# Patient Record
Sex: Male | Born: 1975 | Race: Black or African American | Hispanic: No | Marital: Single | Smoking: Current every day smoker
Health system: Southern US, Community
[De-identification: ages and names within clinical notes are randomized; demographics above are authoritative.]

## PROBLEM LIST (undated history)

## (undated) DIAGNOSIS — F1010 Alcohol abuse, uncomplicated: Secondary | ICD-10-CM

## (undated) DIAGNOSIS — I10 Essential (primary) hypertension: Secondary | ICD-10-CM

## (undated) DIAGNOSIS — Z720 Tobacco use: Secondary | ICD-10-CM

## (undated) DIAGNOSIS — N289 Disorder of kidney and ureter, unspecified: Secondary | ICD-10-CM

## (undated) DIAGNOSIS — N4830 Priapism, unspecified: Secondary | ICD-10-CM

---

## 1998-08-22 ENCOUNTER — Emergency Department (HOSPITAL_COMMUNITY): Admission: EM | Admit: 1998-08-22 | Discharge: 1998-08-22 | Payer: Self-pay | Admitting: Emergency Medicine

## 1998-08-22 ENCOUNTER — Encounter: Payer: Self-pay | Admitting: Emergency Medicine

## 2003-06-02 ENCOUNTER — Emergency Department (HOSPITAL_COMMUNITY): Admission: EM | Admit: 2003-06-02 | Discharge: 2003-06-02 | Payer: Self-pay | Admitting: *Deleted

## 2011-03-04 ENCOUNTER — Emergency Department: Payer: Self-pay | Admitting: Emergency Medicine

## 2011-03-31 ENCOUNTER — Emergency Department: Payer: Self-pay | Admitting: Unknown Physician Specialty

## 2012-06-15 ENCOUNTER — Emergency Department (HOSPITAL_COMMUNITY)
Admission: EM | Admit: 2012-06-15 | Discharge: 2012-06-15 | Disposition: A | Discharge status: Home / Self Care | Payer: Self-pay | Attending: Emergency Medicine | Admitting: Emergency Medicine

## 2012-06-15 ENCOUNTER — Encounter (HOSPITAL_COMMUNITY): Payer: Self-pay | Admitting: *Deleted

## 2012-06-15 DIAGNOSIS — Z79899 Other long term (current) drug therapy: Secondary | ICD-10-CM | POA: Insufficient documentation

## 2012-06-15 DIAGNOSIS — I10 Essential (primary) hypertension: Secondary | ICD-10-CM | POA: Insufficient documentation

## 2012-06-15 DIAGNOSIS — F17200 Nicotine dependence, unspecified, uncomplicated: Secondary | ICD-10-CM | POA: Insufficient documentation

## 2012-06-15 DIAGNOSIS — F1010 Alcohol abuse, uncomplicated: Secondary | ICD-10-CM | POA: Insufficient documentation

## 2012-06-15 HISTORY — DX: Essential (primary) hypertension: I10

## 2012-06-15 HISTORY — DX: Alcohol abuse, uncomplicated: F10.10

## 2012-06-15 LAB — RAPID URINE DRUG SCREEN, HOSP PERFORMED
Amphetamines: NOT DETECTED
Barbiturates: NOT DETECTED
Benzodiazepines: NOT DETECTED
Cocaine: NOT DETECTED
Opiates: NOT DETECTED
Tetrahydrocannabinol: POSITIVE — AB

## 2012-06-15 LAB — CBC
HCT: 42.3 % (ref 39.0–52.0)
Hemoglobin: 14.9 g/dL (ref 13.0–17.0)
MCH: 32.3 pg (ref 26.0–34.0)
MCHC: 35.2 g/dL (ref 30.0–36.0)
MCV: 91.8 fL (ref 78.0–100.0)
Platelets: 256 10*3/uL (ref 150–400)
RBC: 4.61 MIL/uL (ref 4.22–5.81)
RDW: 13.1 % (ref 11.5–15.5)
WBC: 9.1 10*3/uL (ref 4.0–10.5)

## 2012-06-15 LAB — ETHANOL: Alcohol, Ethyl (B): <11 mg/dL (ref 0–11)

## 2012-06-15 LAB — COMPREHENSIVE METABOLIC PANEL
ALT: 16 U/L (ref 0–53)
AST: 24 U/L (ref 0–37)
Albumin: 3.7 g/dL (ref 3.5–5.2)
Alkaline Phosphatase: 109 U/L (ref 39–117)
BUN: 11 mg/dL (ref 6–23)
CO2: 23 mEq/L (ref 19–32)
Calcium: 9.2 mg/dL (ref 8.4–10.5)
Chloride: 101 mEq/L (ref 96–112)
Creatinine, Ser: 0.97 mg/dL (ref 0.50–1.35)
GFR calc Af Amer: >90 mL/min (ref >90)
GFR calc non Af Amer: >90 mL/min (ref >90)
Glucose, Bld: 86 mg/dL (ref 70–99)
Potassium: 4 mEq/L (ref 3.5–5.1)
Sodium: 134 mEq/L — ABNORMAL LOW (ref 135–145)
Total Bilirubin: 0.2 mg/dL — ABNORMAL LOW (ref 0.3–1.2)
Total Protein: 7.4 g/dL (ref 6.0–8.3)

## 2012-06-15 NOTE — Discharge Instructions (Signed)
Your testing has been normal, you are not in alcohol withdrawal and can safely pursue outpatient detox therapy. Please see the attached laboratory results from your stay, call the number below if you want to pursue an alternative method of detox. At this time he did not need inpatient detox.  RESOURCE GUIDE  Chronic Pain Problems: Contact [facility]  [phone] Patients need to be referred by their primary care doctor.  Insufficient Money for Medicine: Contact United Way:  call "211."   No Primary Care Doctor: - Call Health Connect  [phone] - can help you locate a primary care doctor that  accepts your insurance, provides certain services, etc. - Physician Referral Service- [phone]  Agencies that provide inexpensive medical care: - [facility]  [phone] - [facility]  [phone] - [facility]  [phone] - Women's Clinic  [phone] - Planned Parenthood  [phone] - [facility]  [phone]  Medicaid-accepting [facility] Providers: - [facility]- [address]  [phone], Mon-Fri 9am-7pm, Sat 9am-1pm - [facility]- [address] [place], Suite [place]  [phone] - [facility]- [address] [place]  [phone] [facility]- [address]  [phone] - Renaye Rakers- [address] [place], [address], [phone]  Only accepts [place] patients after they have their name  applied to their card  Self Pay (no insurance) in [place]: - Sickle Cell Patients: Dr Willey Blade, [facility]  [address] [place], [phone] - [facility]- [address]  [phone]       [facility]- [address]       -     [facility]- see information above (Speak to [employer] if you do not have insurance)       -  [facility]- [address],  [phone]       -  [facility]- [address], [phone]       -  Dr Julio Sicks-  [address], [place], [phone]       -  [facility] - [address], [phone]       -  [facility]- [address] [phone], also [address] [phone]       -    [facility]- [address] [place], [phone], 1st & 3rd Saturday        every month, 10am-1pm  1) Find a Doctor and Pay Out of Pocket Although you won't have to find out who is covered by your insurance plan, it is a good idea to ask around and get recommendations. You will then need to call the office and see if the doctor you have chosen will accept you as a new patient and what types of options they offer for patients who are self-pay. Some doctors offer discounts or will set up payment plans for their patients who do not have insurance, but you will need to ask so you aren't surprised when you get to your appointment.  2) Contact Your Local Health Department Not all health departments have doctors that can see patients for sick visits, but many do, so it is worth a call to see if yours does.  If you don't know where your local health department is, you can check in your phone book. The CDC also has a tool to help you locate your state's health department, and many state websites also have listings of all of their local health departments.  3) Find a Walk-in Clinic If your illness is not likely to be very severe or complicated, you may want to try a walk in clinic. These are popping up all over the country in pharmacies, drugstores, and shopping centers. They're usually staffed by nurse practitioners or physician assistants that have been trained to treat common illnesses and complaints. They're usually fairly quick and inexpensive. However, if you have serious medical issues or chronic medical problems, these are probably not your best  option  STD Testing - [facility] Department of [facility], STD Clinic, [address] [place], phone [phone] or [phone].  Monday - Friday, call for an appointment. [facility] Department of [employer], STD Clinic, [address], [place], phone [phone] or [phone].  Monday - Friday, call for an appointment.  Abuse/Neglect: [facility] Child Abuse Hotline 502-463-5835 [facility] Child Abuse Hotline 331-343-6743 (After Hours)  Emergency Shelter:  [facility] (934)467-3574  Maternity Homes: - Room at the [place] [facility] 330-151-9877 - [facility] ([phone]  MRSA Hotline #:   562-454-4534  [facility] Resources [facility] [place]  United Way [facility] Dept. [address]                 [address]         [address] [place] [address]  Blondell Reveal Phone:  [phone]                                  Phone:  [phone]                   Phone:  [phone]  [facility], [phone] - [facility]- [phone]       -     [facility] in [place], [address]             [phone], Insurance  [place] Child Abuse Hotline 580-763-9523 or 778-030-6728 (After Hours)  Dental Assistance  If unable to pay or uninsured, contact:  [facility]. to become qualified for the adult dental clinic.  Patients with Medicaid: [facility] [phone] [address], [phone] [address] [phone]  If unable to pay, or uninsured, contact [facility] [phone] in [place], [phone] in [place] [facility]) to become qualified for the adult dental clinic  Other Low-Cost Community Dental Services: - [facility]- [address] Natasha Bence [place], [place], [address], [phone], Ext. [phone], 2nd and 4th Thursday of the month at 6:30am.  10 clients each  day by appointment, can sometimes see walk-in patients if someone does not show for an appointment. [facility]- [address] Ether Griffins [place], [place], [address], [phone] - [facility] [address], [place], [place], [address], [phone] - [facility]- [phone] [facility] Health Department- [phone] [facility] [phone]       Behavioral Health Resources in the [facility]  Intensive Outpatient Programs: [facility]      [address] [place], [place] [phone] Both a day and evening program       [facility] Outpatient     [address]        [place], [place] [address] [phone]         ADS: [facility] [address] [place] [phone]  [facility] ACCESS LINE: 5804130912 or [phone] [address] [place], [place] [address] [email or web address]  Behavioral Health Services  Substance Abuse Resources: - [facility]  ([phone] - [facility] [phone])[phone] - [facility] [phone] Floydene Flock [phone] - Residential & Outpatient Substance Abuse Program  ([phone]  Psychological Services: [facility]  [phone] [facility]  [phone] - [facility], [phone] [place]. [address] [place], ACCESS LINE: [phone] or 6400412752, [email or web address]  Mobile Crisis Teams:                                        Therapeutic Alternatives         Mobile Crisis Care Unit ([phone]             [facility] [address] Ginette Otto [phone]                                          Interventionist [address] De[place] [address] [place] [phone]  Self-Help/Support Groups: Mental Health Assoc. of [employer] of support groups 717-636-6700 (call for more info)   Narcotics Anonymous (NA) Caring Services [address] [place] - 2 meetings at this location  Residential Treatment Programs:  [facility]      [address]        [place]       [phone]         [facility] [address], [place] [address] [place], [place]  [address] [phone]  [facility]  [address] [place], [place] [address] [phone] Admissions: 8am-3pm M-F  [facility]     [address]        [place], [place] [address]       [phone]         [facility] [address] Starling Manns [place], [place] [phone]  [facility] [address] [place], [place] [phone]  [facility] - Intensive Outpatient      [address]     [place], [place]       [phone]         [facility] ([facility]     [address] [place], [place] [phone] or [phone]  Residential Treatment Services (RTS), Medicaid [address] [place], [place] [phone]  [facility] [address]                                               [address] [place] [phone]  [facility] Resources: [place]- [phone]               General Therapy                                                Angie Fava, PhD        [address] [place], [place] [address]         [phone]   Insurance  [facility]   [address] [place], [place] [address] [phone]  [facility] [address] [place], [place] [address] [phone] Insurance/Medicaid/sponsorship through [facility] and Families                                               [address]                                        [place], [place] [address]    Therapy/tele-psych/case         [phone]          [facility] [address], [place]  [address]  Adolescent/group home/case management [phone]                                           Creola Corn PhD       General therapy       Insurance   [phone]         Dr. Lolly Mustache, Insurance, M-F [phone]

## 2012-06-15 NOTE — ED Provider Notes (Signed)
History     CSN: [id number]  Arrival date & time 06/15/12  0215   First MD Initiated Contact with Patient 06/15/12 0252      Chief Complaint  Patient presents with  . Alcohol Problem    (Consider location/radiation/quality/duration/timing/severity/associated sxs/prior treatment) HPI Comments: 38 year old male who presents with a complaint of alcohol abuse. He was seen at the adult drug services who told him to come here for medical clearance for outpatient intense substance abuse therapy. The patient states that he was recently incarcerated on drug charges, spent 3 years in jail and when he was really started drinking alcohol heavily. He also drank heavily before going into jail. His last drink was approximately 48 hours ago, he has no withdrawal symptoms and states that he has never had any withdrawal symptoms including no seizures, no tremor, no delirium tremens. He is ambulatory, tolerating oral food and fluids without any difficulty and has had no nausea vomiting or abdominal pain.  Patient is a 37 y.o. male presenting with alcohol problem. The history is provided by the patient.  Alcohol Problem    Past Medical History  Diagnosis Date  . Hypertension   . ETOH abuse     No past surgical history on file.  No family history on file.  History  Substance Use Topics  . Smoking status: Current Every Day Smoker  . Smokeless tobacco: Not on file  . Alcohol Use: Yes      Review of Systems  All other systems reviewed and are negative.    Allergies  Review of patient's allergies indicates no known allergies.  Home Medications   Current Outpatient Rx  Name  Route  Sig  Dispense  Refill  . ADULT MULTIVITAMIN W/MINERALS CH   Oral   Take 1 tablet by mouth daily.         Marland Kitchen NIACIN PO   Oral   Take 1 tablet by mouth daily. OTC           BP 161/67  Pulse 100  Temp 98.5 F (36.9 C) (Oral)  Resp 18  SpO2 100%  Physical Exam  Nursing note and vitals  reviewed. Constitutional: He appears well-developed and well-nourished. No distress.  HENT:  Head: Normocephalic and atraumatic.  Mouth/Throat: Oropharynx is clear and moist. No oropharyngeal exudate.  Eyes: Conjunctivae normal and EOM are normal. Pupils are equal, round, and reactive to light. Right eye exhibits no discharge. Left eye exhibits no discharge. No scleral icterus.  Neck: Normal range of motion. Neck supple. No JVD present. No thyromegaly present.  Cardiovascular: Normal rate, regular rhythm, normal heart sounds and intact distal pulses.  Exam reveals no gallop and no friction rub.   No murmur heard. Pulmonary/Chest: Effort normal and breath sounds normal. No respiratory distress. He has no wheezes. He has no rales.  Abdominal: Soft. Bowel sounds are normal. He exhibits no distension and no mass. There is no tenderness.  Musculoskeletal: Normal range of motion. He exhibits no edema and no tenderness.  Lymphadenopathy:    He has no cervical adenopathy.  Neurological: He is alert. Coordination normal.  Skin: Skin is warm and dry. No rash noted. No erythema.  Psychiatric: He has a normal mood and affect. His behavior is normal.    ED Course  Procedures (including critical care time)   Labs Reviewed  CBC  ETHANOL  COMPREHENSIVE METABOLIC PANEL  URINE RAPID DRUG SCREEN (HOSP PERFORMED)   No results found.   1. Alcohol abuse  MDM  The patient is well-appearing, has no abdominal pain, no jaundice, normal lab work and can be safely discharged to followup with his outpatient drug program. The patient is in agreement and this is what he wants.        Vida Roller, MD 06/15/12 [phone]

## 2012-06-15 NOTE — ED Notes (Signed)
In the Adult Drug Services, and told to come here for chronic etoh before starting there program.

## 2012-06-15 NOTE — ED Notes (Signed)
Pt wanded by security, pt cleared 

## 2012-11-26 ENCOUNTER — Encounter (HOSPITAL_BASED_OUTPATIENT_CLINIC_OR_DEPARTMENT_OTHER): Payer: Self-pay

## 2012-11-26 ENCOUNTER — Emergency Department (HOSPITAL_BASED_OUTPATIENT_CLINIC_OR_DEPARTMENT_OTHER)
Admission: EM | Admit: 2012-11-26 | Discharge: 2012-11-26 | Disposition: A | Discharge status: Home / Self Care | Payer: Self-pay | Attending: Emergency Medicine | Admitting: Emergency Medicine

## 2012-11-26 DIAGNOSIS — I10 Essential (primary) hypertension: Secondary | ICD-10-CM | POA: Insufficient documentation

## 2012-11-26 DIAGNOSIS — F17200 Nicotine dependence, unspecified, uncomplicated: Secondary | ICD-10-CM | POA: Insufficient documentation

## 2012-11-26 DIAGNOSIS — Z8659 Personal history of other mental and behavioral disorders: Secondary | ICD-10-CM | POA: Insufficient documentation

## 2012-11-26 DIAGNOSIS — B86 Scabies: Secondary | ICD-10-CM

## 2012-11-26 DIAGNOSIS — Z79899 Other long term (current) drug therapy: Secondary | ICD-10-CM | POA: Insufficient documentation

## 2012-11-26 MED ORDER — PERMETHRIN 5 % EX CREA: TOPICAL_CREAM | CUTANEOUS | Status: DC

## 2012-11-26 NOTE — ED Notes (Signed)
Pt reports tender rash on back and abdomen.

## 2012-11-26 NOTE — ED Provider Notes (Signed)
Medical screening examination/treatment/procedure(s) were performed by non-physician practitioner and as supervising physician I was immediately available for consultation/collaboration.  Derwood Kaplan, MD 11/26/12 [phone]

## 2012-11-26 NOTE — ED Provider Notes (Signed)
   History    CSN: [id number] Arrival date & time 11/26/12  2042  First MD Initiated Contact with Patient 11/26/12 2051     Chief Complaint  Patient presents with  . Rash   (Consider location/radiation/quality/duration/timing/severity/associated sxs/prior Treatment) HPI Comments: Pt states he developed rash to trunk front and back that is very itchy  Patient is a 37 y.o. male presenting with rash. The history is provided by the patient. No language interpreter was used.  Rash Pain location: trunk. Pain severity:  No pain Duration:  2 weeks Timing:  Constant Progression:  Worsening Context: not diet changes and not eating   Relieved by:  Nothing Worsened by:  Nothing tried Ineffective treatments:  OTC medications  Past Medical History  Diagnosis Date  . Hypertension   . ETOH abuse    History reviewed. No pertinent past surgical history. No family history on file. History  Substance Use Topics  . Smoking status: Current Every Day Smoker -- 1.00 packs/day    Types: Cigarettes  . Smokeless tobacco: Not on file  . Alcohol Use: No     Comment: sober for 20 days    Review of Systems  Constitutional: Negative.   Respiratory: Negative.   Cardiovascular: Negative.   Skin: Positive for rash.    Allergies  Review of patient's allergies indicates no known allergies.  Home Medications   Current Outpatient Rx  Name  Route  Sig  Dispense  Refill  . Multiple Vitamin (MULTIVITAMIN WITH MINERALS) TABS   Oral   Take 1 tablet by mouth daily.         Marland Kitchen NIACIN PO   Oral   Take 1 tablet by mouth daily. OTC         . permethrin (ELIMITE) 5 % cream      Apply to affected area once:shower after 10-14 hours:use again in 2 weeks for return of symptoms   60 g   0    BP 146/103  Pulse 100  Temp(Src) 98.6 F (37 C) (Oral)  Resp 20  Ht 5\' 11"  (1.803 m)  Wt 316 lb (143.337 kg)  BMI 44.09 kg/m2  SpO2 100% Physical Exam  Nursing note and vitals  reviewed. Constitutional: He is oriented to person, place, and time. He appears well-developed and well-nourished.  Cardiovascular: Normal rate and regular rhythm.   Pulmonary/Chest: Effort normal and breath sounds normal.  Musculoskeletal: Normal range of motion.  Neurological: He is alert and oriented to person, place, and time.  Skin:  Papules noted to abdomen and back around the waist line    ED Course  Procedures (including critical care time) Labs Reviewed - No data to display No results found. 1. Scabies     MDM  Rash consistent with scabies:will treat  Teressa Lower, NP 11/26/12 [phone]

## 2014-01-02 ENCOUNTER — Ambulatory Visit: Payer: Self-pay | Admitting: Urology

## 2014-01-02 ENCOUNTER — Emergency Department: Payer: Self-pay | Admitting: Emergency Medicine

## 2014-01-02 LAB — CBC WITH DIFFERENTIAL/PLATELET
BASOS PCT: 1 %
Basophil #: 0.1 10*3/uL (ref 0.0–0.1)
Eosinophil #: 0.1 10*3/uL (ref 0.0–0.7)
Eosinophil %: 1.4 %
HCT: 40.8 % (ref 40.0–52.0)
HGB: 13.7 g/dL (ref 13.0–18.0)
LYMPHS ABS: 1.7 10*3/uL (ref 1.0–3.6)
Lymphocyte %: 21.7 %
MCH: 31.8 pg (ref 26.0–34.0)
MCHC: 33.6 g/dL (ref 32.0–36.0)
MCV: 95 fL (ref 80–100)
MONO ABS: 0.6 x10 3/mm (ref 0.2–1.0)
Monocyte %: 7.1 %
Neutrophil #: 5.5 10*3/uL (ref 1.4–6.5)
Neutrophil %: 68.8 %
PLATELETS: 243 10*3/uL (ref 150–440)
RBC: 4.31 10*6/uL — AB (ref 4.40–5.90)
RDW: 13.4 % (ref 11.5–14.5)
WBC: 8 10*3/uL (ref 3.8–10.6)

## 2014-01-02 LAB — BASIC METABOLIC PANEL
ANION GAP: 8 (ref 7–16)
BUN: 8 mg/dL (ref 7–18)
CALCIUM: 8.8 mg/dL (ref 8.5–10.1)
CHLORIDE: 104 mmol/L (ref 98–107)
Co2: 26 mmol/L (ref 21–32)
Creatinine: 0.9 mg/dL (ref 0.60–1.30)
EGFR (African American): >60
Glucose: 101 mg/dL — ABNORMAL HIGH (ref 65–99)
Osmolality: 274 (ref 275–301)
POTASSIUM: 4.1 mmol/L (ref 3.5–5.1)
Sodium: 138 mmol/L (ref 136–145)

## 2014-09-14 NOTE — Consult Note (Addendum)
PATIENT NAMECashel Hammond, Bill Hammond MR#:  [id number] DATE OF BIRTH:  1976/03/28  DATE OF CONSULTATION:  03/04/2011  REFERRING PHYSICIAN:  Dr. Buford Dresser from the Emergency Department  CONSULTING PHYSICIAN:  Marin Olp, MD  PRIMARY CARE PHYSICIAN: None  REASON FOR CONSULTATION: Priapism.  HISTORY OF PRESENT ILLNESS: The patient is a 39 year old African American male without prior history of priapism who developed a firm, rigid erection at 10:00 in the morning the day of presentation. The patient presented to the Emergency Department after approximately 5.5 hours when the erection did not subside. The patient reports only moderate discomfort (4 to 5/10) but no real pain. The patient denies being involved in any sexual activity. The patient denies using any illicit or prescription drugs or medications. The patient denies any recent genital trauma. The patient denies any family history of sickle cell disease or sickle cell trait.   PAST MEDICAL HISTORY: History of hypertension (managed without medications).   PAST SURGICAL HISTORY: None.  FAMILY HISTORY: Significant for adenocarcinoma of the prostate in the patient's grandfather's brother who was diagnosed in his 20s. Also significant for breast and ovarian carcinoma as well as throat cancer. Again, the patient denies any history of sickle cell disease or sickle cell trait.   ALLERGIES: No known drug allergies.  MEDICATIONS: None.   HABITS: The patient reports a 1 pack per day smoking habit for the past 15 years. Alcohol-The patient report using 1 beer and 3 shots of liquor every night.   REVIEW OF SYSTEMS: GENERAL: Patient denies any fevers or chills, weight loss, night sweats. HEENT: Patient denies any recent colds or flus. RESPIRATORY: Patient denies any shortness of breath or hemoptysis. CARDIOVASCULAR: Patient denies any chest pain or dyspnea on exertion. GASTROINTESTINAL: Patient denies any abdominal pain, nausea, vomiting. GENITOURINARY: Patient  denies any dysuria or penile discharge. MUSCULOSKELETAL: Patient denies any joint pains or muscle aches. ENDOCRINE: Patient denies any diabetes. HEME: Patient denies any bleeding diathesis. PSYCH: Patient denies any depression or anxiety.   PHYSICAL EXAMINATION: GENERAL: Well developed, well nourished African American male lying on the stretcher in no apparent distress.  HEENT: Normocephalic, atraumatic cranium. Patient is anicteric.  NECK: No masses or bruits.  CHEST: Clear to auscultation. Normal effort.   HEART: Regular rate and rhythm without murmurs, gallops or rubs.   ABDOMEN: Soft, flat, nontender, nondistended with normoactive bowel sounds.  GENITOURINARY: Reveals a normal circumcised phallus, fully erect. Scrotal examination reveals a normal scrotum with bilaterally descended testes without masses or tenderness. 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 No varicoceles appreciates.   IMPRESSION: Priapism-idiopathic. The patient was counseled as to the risk for permanent erectile dysfunction with erections lasting more than four hours. The recommendation was made to perform penile aspiration and phenylephrine injection to attempt to achieve detumescence. The patient was counseled as to the possible need for a Winter shunt in the Operating Room under anesthesia should the penile aspiration and injection be unsuccessful. The patient understands and gives his consent to proceed.   RECOMMENDATION: Penile aspiration and irrigation with phenylephrine.    PROCEDURE NOTE:   PROCEDURE: Penile aspiration and irrigation.   INDICATIONS: Idiopathic priapism.   PROCEDURE IN DETAIL: The patient's right penile shaft was prepped with ChloraPrep swabs x3. A 21-gauge Angiocath was then placed in the mid lateral right penile shaft and was attached a 3-way stop cock to which an empty 20 mL syringe and a 10 mL syringe at the other port containing a phenylephrine solution with 400  mg of  phenylephrine per mL of normal saline. 15 mL of dark maroon blood was aspirated from the penile shaft followed by injection of 1 mL of the 400 mg/mL phenylephrine solution. It should be noted that the patient had been attached to a cardiac monitor prior to the initiation of the procedure. Penile aspiration and injection of phenylephrine was repeated after 10 minutes after no noticeable detumescence was appreciated with the first cycle. With the second cycle 20 mL of blood was able to be aspirated followed by another injection of 1 mL or 400 mg of phenylephrine. Some modest detumescence was appreciated and after 10 minutes a third cycle of aspiration and injection was performed. More noticeable detumescence was achieved, approximately 50% and after 10 minutes a fourth aspiration was performed again with 20 mL of blood aspirated and an injection of 200 mcg of phenylephrine resulted in rapid complete detumescence. The total amount of phenylephrine injected equals 1400 mcg. The patient remained stable throughout the procedure with blood pressure remaining less than 140/80. After complete detumescence was achieved, the Angiocath was withdrawn and firm pressure held on the puncture site with a gauze dressing. Observation over the next 10 minutes confirmed persistence in the detumescence of the penis as well as the absence of any ecchymosis or hematoma. It is advised that the patient be monitored for a total of 30 minutes after the procedure and the patient was also advised to abstain from any sexual stimulation or activity for one week. The patient is advised to return immediately to the Emergency Room for any erections lasting more than two hours.   ____________________________ Marin OlpJay H. Tayen Narang, MD jhk:cms D: 03/04/2011 19:04:18 ET T: 03/05/2011 10:23:51 ET JOB#: [id number]  cc: Marin OlpJay H. Arnitra Sokoloski, MD, <Dictator> Marin OlpJAY H Lysha Schrade MD ELECTRONICALLY SIGNED 07/13/2011 13:27

## 2014-09-18 NOTE — Op Note (Signed)
Patient: This 39 year old Male had a surgical procedure performed on 02-Jan-2014.  Post Operative Report:  Pre-Op Diagnosis Priapism   Post-Op Diagnosis Same   Operation Corporal irrigation with phenylephrine injection   Anesthesia Penile Block   Specimen Type None   Findings Penile erection resolved after corporal irrigation and 1200mcg phenylephrine injection   Surgeon Alford HighlandAdam Velmer Broadfoot, MD   Assistant none   EBL: 250 mL   Complications None   Description of Procedure: Mr. Bill Hammond is a 39 year old man with 2 prior priapism episodes who presented to the ED with 5 hours of painful erection. After discussion of the risks and benefits in detail, he elected to undergo bedside corporal irrigation with phenylephrine injection in order to decrease his erection. He understood the risk of erectile dysfunction, persistent penile pain, injection, bleeding requiring transfusion and that the procedure had about a 60% chance of success with an erection having lasted this long.  The patient was placed in a supine position and the genitalia were prepped with betadine and draped.  He was on continuous cardiac monitoring, blood pressure cycling, IV fluids and oxygen.  1mg  of dilaudid and 1mg  of versed were given prior to the procedure. A dorsal penile block and penile ring block was performed with 10cc of 1% lidocaine. A 36F butterfly needle was passed through the left aspect of the glans parallel and 1.5cm lateral to the urethra piercing through the tip of the corporal body.  Approximately 60cc of dark black blood was aspirated from the corporal body.  A second butterfly needle was passed in the same fashion on the right side.  An additional 60cc of blood was aspirated. Using a 3-way stop cock, 10cc normal saline was injected through the left side and aspirated through both sides. This was repeated several times until the returning aspirate was no longer dark.  Following this, 2cc aliquots of phenylephrine  11400mcg/cc (200mcg injections) were injected through the butterfly needle into the corporal body on the left.  This was repeated 5 times until the erection was completely resolved.  Blood pressure was assessed after each injection and never went above 146/86.   The butterfly needles were removed and the erection remained resolved.  The patient rested comfortably in the ED for 2 hours post-procedure to assure resolution.  He tolerated the procedure well.   Orders:   Sodium Chloride 0.9%, 1000 ml at 999 ml/hr, Stop After: 1 Doses, 02-Jan-2014, Completed, Standard   Lidocaine 1% injection, 10 ml, Injectable, STAT, 02-Jan-2014, Completed, Standard   Phenylephrine injection, ( Neo-Synephrine injection )  1 mg, Intramuscular, STAT  Indication: Hypotension in Shock, SVT, 100 mcg per cc, 02-Jan-2014, Completed, Standard   Oxygen- ED, -Liters per :2, 02-Jan-2014, Active, Standard  Electronic Signatures: Graceann CongressKaplan, Aloria Looper G (MD)  (Signed 08-Aug-15 16:54)  Authored: Patient and Date/Time, Operative Note, Orders   Last Updated: 08-Aug-15 16:54 by Graceann CongressKaplan, Jaslynn Thome G (MD)

## 2014-09-18 NOTE — Consult Note (Signed)
Admit Reason:   Priapism (607.3): Onset Date: 02-Jan-2014, Status: Active, Coding System: ICD9, Coded Name: Priapism    recurrent priapism:    HTN:   Lab Results: Routine Chem:  08-Aug-15 14:46   Glucose, Serum  101  BUN 8  Creatinine (comp) 0.90  Sodium, Serum 138  Potassium, Serum 4.1  Chloride, Serum 104  CO2, Serum 26  Calcium (Total), Serum 8.8  Anion Gap 8  Osmolality (calc) 274  eGFR (African American) >60  eGFR (Non-African American) >60 (eGFR values <34m/min/1.73 m2 may be an indication of chronic kidney disease (CKD). Calculated eGFR is useful in patients with stable renal function. The eGFR calculation will not be reliable in acutely ill patients when serum creatinine is changing rapidly. It is not useful in  patients on dialysis. The eGFR calculation may not be applicable to patients at the low and high extremes of body sizes, pregnant women, and vegetarians.)  Result Comment POTASSIUM/CREATININE - Slight hemolysis, interpret results with  - caution.  Result(s) reported on 02 Jan 2014 at 03:08PM.  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  This is his third episode of priapism, his last being in 2012, and prior episodes were managed with corporal irrigation according to the patient.  He denies drug use, no psychiatric medication history, no penile or GU trauma history,  no history of sickle cell anemia or sickle cell trait.   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, now with 5 hours of painful erection   Plan 1) Penile Corporal irrigation with normal saline and phenylephrine injection performed (see separate note) with cardiac monitoring -- erection resolved 2) IV fluids, O2 supplementation & pain control administered 3) Monitor in ED for 2 hours to confirm erection resolved prior to discharge 4) Urology follow-up suggested   Electronic Signatures: KPrentiss Bells(MD)  (Signed 08-Aug-15 16:45)  Authored: Health Issues, Significant Events - History, Labs, Allergies, General Aspect/Present Illness, History and Physical Exam, Impression/Plan   Last Updated: 08-Aug-15 16:45 by KPrentiss Bells(MD)

## 2014-11-24 ENCOUNTER — Emergency Department
Admission: EM | Admit: 2014-11-24 | Discharge: 2014-11-24 | Disposition: A | Discharge status: Home / Self Care | Payer: Self-pay | Attending: Emergency Medicine | Admitting: Emergency Medicine

## 2014-11-24 ENCOUNTER — Encounter: Payer: Self-pay | Admitting: Emergency Medicine

## 2014-11-24 DIAGNOSIS — F1110 Opioid abuse, uncomplicated: Secondary | ICD-10-CM | POA: Insufficient documentation

## 2014-11-24 DIAGNOSIS — I10 Essential (primary) hypertension: Secondary | ICD-10-CM | POA: Insufficient documentation

## 2014-11-24 DIAGNOSIS — Z720 Tobacco use: Secondary | ICD-10-CM | POA: Insufficient documentation

## 2014-11-24 DIAGNOSIS — F1210 Cannabis abuse, uncomplicated: Secondary | ICD-10-CM | POA: Insufficient documentation

## 2014-11-24 DIAGNOSIS — Z79899 Other long term (current) drug therapy: Secondary | ICD-10-CM | POA: Insufficient documentation

## 2014-11-24 DIAGNOSIS — N4830 Priapism, unspecified: Secondary | ICD-10-CM

## 2014-11-24 LAB — URINE DRUG SCREEN, QUALITATIVE (ARMC ONLY)
Amphetamines, Ur Screen: NOT DETECTED
Barbiturates, Ur Screen: NOT DETECTED
Benzodiazepine, Ur Scrn: NOT DETECTED
CANNABINOID 50 NG, UR ~~LOC~~: POSITIVE — AB
COCAINE METABOLITE, UR ~~LOC~~: NOT DETECTED
MDMA (ECSTASY) UR SCREEN: NOT DETECTED
Methadone Scn, Ur: NOT DETECTED
OPIATE, UR SCREEN: POSITIVE — AB
PHENCYCLIDINE (PCP) UR S: NOT DETECTED
Tricyclic, Ur Screen: NOT DETECTED

## 2014-11-24 MED ORDER — ONDANSETRON HCL 4 MG/2ML IJ SOLN
4.0000 mg | Freq: Once | INTRAMUSCULAR | Status: AC
  Administered 2014-11-24: 4 mg via INTRAVENOUS

## 2014-11-24 MED ORDER — PHENYLEPHRINE HCL 0.5 % NA SOLN
NASAL | Status: AC
  Filled 2014-11-24: qty 15

## 2014-11-24 MED ORDER — ONDANSETRON HCL 4 MG/2ML IJ SOLN
INTRAMUSCULAR | Status: AC
  Administered 2014-11-24: 4 mg via INTRAVENOUS
  Filled 2014-11-24: qty 2

## 2014-11-24 MED ORDER — MORPHINE SULFATE 4 MG/ML IJ SOLN
INTRAMUSCULAR | Status: AC
  Administered 2014-11-24: 4 mg via INTRAVENOUS
  Filled 2014-11-24: qty 1

## 2014-11-24 MED ORDER — LIDOCAINE HCL (PF) 1 % IJ SOLN
INTRAMUSCULAR | Status: AC
  Filled 2014-11-24: qty 10

## 2014-11-24 MED ORDER — PHENYLEPHRINE 200 MCG/ML FOR PRIAPISM / HYPOTENSION
200.0000 ug | Freq: Once | INTRAMUSCULAR | Status: DC
  Filled 2014-11-24: qty 50

## 2014-11-24 MED ORDER — MORPHINE SULFATE 4 MG/ML IJ SOLN
4.0000 mg | Freq: Once | INTRAMUSCULAR | Status: AC
  Administered 2014-11-24: 4 mg via INTRAVENOUS

## 2014-11-24 NOTE — ED Provider Notes (Signed)
[facility] Emergency Department Provider Note    ____________________________________________  Time seen: [id number]  I have reviewed the triage vital signs and the nursing notes.   HISTORY  Chief Complaint Penis Pain   History limited by: Not Limited   HPI Bill Hammond is a 39 y.o. male who presents to the emergency department today with concerns for priapism. Patient states that he has had an erect penis for 6 hours.patient states that this is now the fourth time he has had to seek medical care for priapism. He has required phenylephrine injections in the past.he states that he has not had a full workup for priapism. He denies any known triggers. I-125 alcohol maybe trigger but denies any alcohol use on a blunt today's episode. States he did have a three-hour erection last week however that went away on its own.     Past Medical History  Diagnosis Date  . Hypertension   . ETOH abuse     There are no active problems to display for this patient.   No past surgical history on file.  Current Outpatient Rx  Name  Route  Sig  Dispense  Refill  . Multiple Vitamin (MULTIVITAMIN WITH MINERALS) TABS   Oral   Take 1 tablet by mouth daily.         Marland Kitchen. NIACIN PO   Oral   Take 1 tablet by mouth daily. OTC         . permethrin (ELIMITE) 5 % cream      Apply to affected area once:shower after 10-14 hours:use again in 2 weeks for return of symptoms   60 g   0     Allergies Review of patient's allergies indicates no known allergies.  History reviewed. No pertinent family history.  Social History History  Substance Use Topics  . Smoking status: Current Every Day Smoker -- 1.00 packs/day    Types: Cigarettes  . Smokeless tobacco: Not on file  . Alcohol Use: Yes     Comment: sober for 20 days    Review of Systems  Constitutional: Negative for fever. Cardiovascular: Negative for chest pain. Respiratory: Negative for shortness of  breath. Gastrointestinal: Negative for abdominal pain, vomiting and diarrhea. Genitourinary: Negative for dysuria. Musculoskeletal: Negative for back pain. Skin: Negative for rash. Neurological: Negative for headaches, focal weakness or numbness.   10-point ROS otherwise negative.  ____________________________________________   PHYSICAL EXAM:  VITAL SIGNS: ED Triage Vitals  Enc Vitals Group     BP 11/24/14 1029 150/105 mmHg     Pulse Rate 11/24/14 1029 76     Resp 11/24/14 1029 18     Temp 11/24/14 1029 98 F (36.7 C)     Temp Source 11/24/14 1029 Oral     SpO2 11/24/14 1029 98 %     Weight 11/24/14 1029 275 lb (124.739 kg)     Height 11/24/14 1029 6' (1.829 m)     Head Cir --      Peak Flow --      Pain Score 11/24/14 1029 10   Constitutional: Alert and oriented. Well appearing and in no distress. Eyes: Conjunctivae are normal. PERRL. Normal extraocular movements. ENT   Head: Normocephalic and atraumatic.   Nose: No congestion/rhinnorhea.   Mouth/Throat: Mucous membranes are moist.   Neck: No stridor. Cardiovascular: Normal rate Respiratory: Normal respiratory effort without tachypnea nor retractions. Genitourinary: tumid penile shaft. Minimally tender to palpation. No lesions appreciated. Musculoskeletal: Normal range of motion in all extremities.  No joint effusions.  No lower extremity tenderness nor edema. Neurologic:  Normal speech and language. No gross focal neurologic deficits are appreciated. Speech is normal.  Skin:  Skin is warm, dry and intact. No rash noted. Psychiatric: Mood and affect are normal. Speech and behavior are normal. Patient exhibits appropriate insight and judgment.  ____________________________________________    LABS (pertinent positives/negatives)  Labs Reviewed  URINE DRUG SCREEN, QUALITATIVE ([facility] ONLY) - Abnormal; Notable for the following:    Opiate, Ur Screen POSITIVE (*)    Cannabinoid 50 Ng, Ur [place] POSITIVE (*)     All other components within normal limits  SICKLE CELL SCREEN     ____________________________________________   EKG  None  ____________________________________________    RADIOLOGY  None  ____________________________________________   PROCEDURES  Procedure(s) performed: None  Critical Care performed: No ____________________________________________   INITIAL IMPRESSION / ASSESSMENT AND PLAN / ED COURSE  Pertinent labs & imaging results that were available during my care of the patient were reviewed by me and considered in my medical decision making (see chart for details).  Patient presents to the emergency department with priapism that has lasted 6 hours. He has history of same. On exam patient with a tumid shaft. Dr. Apolinar Junes with urology has been contacted who will come and evaluate the patient.  Urology did perform a penile injection with good detumescence. Discuss with patient importance to follow up with urology for further evaluation.  ____________________________________________   FINAL CLINICAL IMPRESSION(S) / ED DIAGNOSES  Final diagnoses:  Priapism     Phineas Semen, MD 11/24/14 [phone]

## 2014-11-24 NOTE — Discharge Instructions (Signed)
Please seek medical attention for any high fevers, chest pain, shortness of breath, change in behavior, persistent vomiting, bloody stool or any other new or concerning symptoms.  Priapism Priapism is a persistent, often painful erection. It is the hardening of the penis in males and of the clitoris in females, even without sexual stimulation. Priapism may come on suddenly. Priapism may last a short while, or may last a long time. Priapism occurs in all ages. The types of priapism include:  Acute prolonged priapism--Priapism that comes on suddenly and lasts.  Recurrent acute priapism--Priapism that comes on suddenly and tends to happen again.  Chronic priapism--Priapism that is persistent but with less of an erection. CAUSES  There are many causes. Causes include:  Blood problems common in people with the following diseases:  Sickle cell disease.  Leukemia.  Side effects of erectile dysfunction medicine. This is the most common cause of priapism.  Side effects of prescription medicine used in the treatment of depression and anxiety.  Illegal use of street drugs such as cocaine and marijuana.  Excessive use of alcohol.  Neurological problems such as multiple sclerosis.  Diabetes mellitus.  The cause may be unknown. SIGNS AND SYMPTOMS  A prolonged erection, usually without sexual stimulation or following the use of erectile dysfunction medicine.  A painful erection. DIAGNOSIS Diagnosis of priapism can usually be confirmed by your health care provider after a physical exam. Your health care provider may have blood tests done to search for a potential cause, such as leukemia or sickle cell disease. TREATMENT  Treatments depend on the cause. Some specific treatments include:  Oxygen and red blood cell transfusions in patients with sickle cell disease.  A special treatment for plasma in those with leukemia.  Removing blood that is trapped.  Treatment with  medicine.  Surgical shunting (a passage that is made to allow blood to flow from one part of the body to another). HOME CARE INFORMATION  Avoid sexual stimulation and intercourse until your health care provider says it is okay.  Avoid the use of alcohol or drugs to minimize recurrence of priapism. SEEK MEDICAL CARE IF:  You experience worsening pain instead of improvement. SEEK IMMEDIATE MEDICAL CARE IF:  You experience fever or shaking chills.  You experience pain, swelling, or redness in your genital or groin area. MAKE SURE YOU:  Understand these instructions.   Will watch your condition.  Will get help right away if you are not doing well or get worse. Document Released: 08/04/2003 Document Revised: 03/04/2013 Document Reviewed: 10/16/2012 [facility] Patient Information 2015 [place], [place]. This information is not intended to replace advice given to you by your health care provider. Make sure you discuss any questions you have with your health care provider.

## 2014-11-24 NOTE — Consult Note (Signed)
Urology Consult  I have been asked to see the patient by Dr. Derrill KayGoodman, for evaluation and management of priapism.  Chief Complaint: Penile pain  History of Present Illness: Bill Hammond is a 39 y.o. year old with a history of recurrent episodes of priapism who presented earlier today to the emergency room after developing a painful erection which failed to resolve around 4 AM this morning.  He does have a history of priapism with his last occurrence a few weeks ago which resolved spontaneously. Prior to this, he did require several procedures in the emergency room, most recently penile irrigation and phenylephrine injection in 12/2014 by Dr. Arlyce DiceKaplan.  He denies any known history of sickle cell anemia and his family or personal history although he's never been worked up for this. He does have a personal history of substance abuse and is currently in rehabilitation. He denies any illicit drug use, marijuana, or any other medications besides his blood pressure medicines over the past few weeks.  He does drink alcohol regularly.  At baseline, he denies any erectile dysfunction.  He denies any difficulty voiding today including no dysuria, hematuria, urinary hesitancy, or incomplete bladder emptying.  Past Medical History  Diagnosis Date  . Hypertension   . ETOH abuse     No past surgical history on file.- patient denies  Home Medications:    Medication List    ASK your doctor about these medications        hydrochlorothiazide 25 MG tablet  Commonly known as:  HYDRODIURIL  Take 25 mg by mouth daily.     lisinopril 10 MG tablet  Commonly known as:  PRINIVIL,ZESTRIL  Take 10 mg by mouth daily.        Allergies: No Known Allergies  History reviewed. No pertinent family history.  No family history of sickle cell anemia.  Social History:  reports that he has been smoking Cigarettes.  He has been smoking about 1.00 pack per day. He does not have any smokeless tobacco history on  file. He reports that he drinks alcohol. He reports that he does not use illicit drugs.  ROS: A complete review of systems was performed.  All systems are negative except for pertinent findings as noted.  Physical Exam:  Vital signs in last 24 hours: Temp:  [98 F (36.7 C)] 98 F (36.7 C) (06/29 1029) Pulse Rate:  [58-76] 68 (06/29 1230) Resp:  [18] 18 (06/29 1029) BP: (134-154)/(96-110) 134/96 mmHg (06/29 1230) SpO2:  [95 %-99 %] 95 % (06/29 1230) Weight:  [275 lb (124.739 kg)] 275 lb (124.739 kg) (06/29 1029) Constitutional:  Alert and oriented, No acute distress HEENT: [place] AT, moist mucus membranes.  Trachea midline, no masses Cardiovascular: Regular rate and rhythm, no clubbing, cyanosis, or edema. Respiratory: Normal respiratory effort, lungs clear bilaterally GI: Abdomen is soft, nontender, nondistended, no abdominal masses GU: No CVA tenderness.  Fully erect uncircumcised phallus, tender with manipulation. Scrotum unremarkable, swelling, testicles descended bilaterally without masses. Skin: No rashes, bruises or suspicious lesions Lymph: No cervical or inguinal adenopathy Neurologic: Grossly intact, no focal deficits, moving all 4 extremities Psychiatric: Normal mood and affect   Laboratory Data:  Sickle cell and drug screen pending  Radiologic Imaging: N/a  Procedure:   Verbal consent for the procedure was obtained from the patient. At this point time, a dorsal penile block was performed using 10 cc of 1% lidocaine injected into Alcock's canals bilaterally.  Prior to any penile manipulation, he did  also receive IV morphine for additional pain control.  The patient was carefully hemodynamically monitored.  A single injection of 400 g of phenylephrine was administered into the patient's right mid corpora after prepping the area. Within a minute or 2, the penis to completely detumesce. He remained hemodynamically stable with vitals unchanged from his  baseline.  Impression/Assessment:  39 year old male with a history of recurrent priapism now status post successful detumescence requiring a single injection of phenylephrine.  I counseled the patient to continue to hold pressure on his penis to avoid any penile hematoma. I do think it's prudent to follow up on his sickle cell as well as urine drug screen given his history of recurrent priapism. I urged him to return to the emergency room as soon as possible should he develop another incidence of painful erection. We discussed the consequences today of untreated or prolonged priapism including erectile dysfunction.  Plan:  -Follow-up sickle cell and drug tox screen -Patient is now adequately detumesced, appropriate for discharge -Patient offered follow-up with urology -Counseled to avoid illicit drugs which may possibly be contributing to his recurrent episodes of priapism.  11/24/2014, 1:07 PM  Vanna Scotland,  MD

## 2014-11-24 NOTE — ED Notes (Signed)
Patient resting in stretcher. Respirations even and unlabored. No obvious distress. No needs/concerns verbalized at this time. Call bell within reach. Encouraged to call with needs. Will continue to monitor. 

## 2014-11-24 NOTE — ED Notes (Signed)
Pt c/o priapism since 0430 this am, has hx of same

## 2014-11-24 NOTE — ED Notes (Signed)
MD at bedside. 

## 2014-11-25 LAB — SICKLE CELL SCREEN: Sickle Cell Screen: NEGATIVE

## 2014-12-08 ENCOUNTER — Encounter (HOSPITAL_COMMUNITY): Payer: Self-pay

## 2014-12-08 ENCOUNTER — Emergency Department (HOSPITAL_COMMUNITY)
Admission: EM | Admit: 2014-12-08 | Discharge: 2014-12-08 | Disposition: A | Discharge status: Home / Self Care | Payer: Self-pay | Attending: Emergency Medicine | Admitting: Emergency Medicine

## 2014-12-08 DIAGNOSIS — N4830 Priapism, unspecified: Secondary | ICD-10-CM | POA: Insufficient documentation

## 2014-12-08 DIAGNOSIS — Z79899 Other long term (current) drug therapy: Secondary | ICD-10-CM | POA: Insufficient documentation

## 2014-12-08 DIAGNOSIS — I10 Essential (primary) hypertension: Secondary | ICD-10-CM | POA: Insufficient documentation

## 2014-12-08 DIAGNOSIS — Z720 Tobacco use: Secondary | ICD-10-CM | POA: Insufficient documentation

## 2014-12-08 HISTORY — DX: Priapism, unspecified: N48.30

## 2014-12-08 MED ORDER — ONDANSETRON 4 MG PO TBDP
4.0000 mg | ORAL_TABLET | Freq: Once | ORAL | Status: AC
  Administered 2014-12-08: 4 mg via ORAL
  Filled 2014-12-08: qty 1

## 2014-12-08 MED ORDER — LIDOCAINE HCL 2 % IJ SOLN
5.0000 mL | Freq: Once | INTRAMUSCULAR | Status: DC
  Filled 2014-12-08: qty 20

## 2014-12-08 MED ORDER — HYDROMORPHONE HCL 2 MG/ML IJ SOLN
2.0000 mg | Freq: Once | INTRAMUSCULAR | Status: AC
Start: 2014-12-08 — End: 2014-12-08
  Administered 2014-12-08: 2 mg via INTRAMUSCULAR
  Filled 2014-12-08: qty 1

## 2014-12-08 MED ORDER — PHENYLEPHRINE 200 MCG/ML FOR PRIAPISM / HYPOTENSION
50.0000 ug | INTRAMUSCULAR | Status: DC | PRN
  Filled 2014-12-08: qty 50

## 2014-12-08 MED ORDER — LISINOPRIL 10 MG PO TABS: 10.0000 mg | ORAL_TABLET | Freq: Every day | ORAL | Status: DC

## 2014-12-08 MED ORDER — HYDROCHLOROTHIAZIDE 25 MG PO TABS: 25.0000 mg | ORAL_TABLET | Freq: Every day | ORAL | Status: DC

## 2014-12-08 NOTE — ED Notes (Signed)
Pt has had multiple treatments for priapism in last 3 years.  Pt denies any erectile meds.  Pt has been tested for sickle cell and is negative.  Pt has had erection ongoing since 3:30 am.

## 2014-12-08 NOTE — ED Provider Notes (Addendum)
CSN: [id number]     Arrival date & time 12/08/14  4098 History   First MD Initiated Contact with Patient 12/08/14 [phone]     Chief Complaint  Patient presents with  . Priapism      (Consider location/radiation/quality/duration/timing/severity/associated sxs/prior Treatment) HPI Comments: Patient with a history of priapism who presents to day with a complaint of priapism since 3:30 this morning. A she got up to go to the bathroom in the middle of the night he noticed it and is not resolved. Patient was seen on June 20 night for the same symptoms and at that time required to phenylephrine injection.  He was drinking alcohol last night and had some marijuana but states in the past the priapism has happened spontaneously. Patient is currently taking no medications and denies any dysuria.  The history is provided by the patient.    Past Medical History  Diagnosis Date  . Hypertension   . ETOH abuse   . Priapism    History reviewed. No pertinent past surgical history. History reviewed. No pertinent family history. History  Substance Use Topics  . Smoking status: Current Every Day Smoker -- 1.00 packs/day    Types: Cigarettes  . Smokeless tobacco: Not on file  . Alcohol Use: Yes     Comment: sober for 20 days    Review of Systems  All other systems reviewed and are negative.     Allergies  Review of patient's allergies indicates no known allergies.  Home Medications   Prior to Admission medications   Medication Sig Start Date End Date Taking? Authorizing Provider  hydrochlorothiazide (HYDRODIURIL) 25 MG tablet Take 25 mg by mouth daily.   Yes Historical Provider, MD  lisinopril (PRINIVIL,ZESTRIL) 10 MG tablet Take 10 mg by mouth daily.   Yes Historical Provider, MD  naproxen (NAPROSYN) 250 MG tablet Take 250 mg by mouth once.   Yes Historical Provider, MD   BP 152/84 mmHg  Pulse 95  Temp(Src) 98.1 F (36.7 C) (Oral)  Resp 18  SpO2 100% Physical Exam  Constitutional: He  appears well-developed and well-nourished. No distress.  HENT:  Head: Normocephalic and atraumatic.  Eyes: EOM are normal. Pupils are equal, round, and reactive to light.  Cardiovascular: Normal rate.   Pulmonary/Chest: Effort normal.  Abdominal: Soft. Bowel sounds are normal. He exhibits no distension. There is no tenderness.  Genitourinary: Testes normal. Circumcised. Penile tenderness present.  Erect penis with tenderness along the shaft.  No discharge noted  Nursing note and vitals reviewed.   ED Course  Procedures (including critical care time) Labs Review Labs Reviewed - No data to display  Imaging Review No results found.   EKG Interpretation None      MDM   Final diagnoses:  Priapism    Patient presents with a history of priapism in the past most recently injected 2 weeks ago with phenylephrine who presents today with priapism since 3:30 this morning. When he woke up at 3:30 to go to the bathroom and had already started and has not improved. He has now had 3 priapism's 1 resolving spontaneously within the last month and a half. Patient did drink alcohol and smoke marijuana last night but states in the past he has not had any marijuana in symptoms still occurred. He is currently not taking any medications. He denies any history of sickle cell disease however he has never been tested.  On exam patient has an fully erect penis with tenderness along the shaft.  Looking at  prior notes from urology 2 weeks ago patient completely detumesced after 400 g of phenylephrine. We'll attempt to inject with phenylephrine and if patient does not detumesced will consult urology.  10:47 AM Patient has completely to mess with only an IM injection of pain medication. He did not require penile injection today. Discussed with patient avoiding alcohol as I can be a trigger for priapism. Also he was given an updated prescription of his blood pressure medication.  Gwyneth SproutWhitney Kiyani Jernigan, MD 12/08/14  1048  Gwyneth SproutWhitney Aurel Nguyen, MD 12/08/14 1054

## 2015-05-05 ENCOUNTER — Emergency Department (HOSPITAL_COMMUNITY)
Admission: EM | Admit: 2015-05-05 | Discharge: 2015-05-05 | Disposition: A | Discharge status: Home / Self Care | Payer: Self-pay | Attending: Emergency Medicine | Admitting: Emergency Medicine

## 2015-05-05 ENCOUNTER — Encounter (HOSPITAL_COMMUNITY): Payer: Self-pay | Admitting: Emergency Medicine

## 2015-05-05 DIAGNOSIS — I10 Essential (primary) hypertension: Secondary | ICD-10-CM | POA: Insufficient documentation

## 2015-05-05 DIAGNOSIS — N4830 Priapism, unspecified: Secondary | ICD-10-CM | POA: Insufficient documentation

## 2015-05-05 DIAGNOSIS — Z79899 Other long term (current) drug therapy: Secondary | ICD-10-CM | POA: Insufficient documentation

## 2015-05-05 DIAGNOSIS — Z791 Long term (current) use of non-steroidal anti-inflammatories (NSAID): Secondary | ICD-10-CM | POA: Insufficient documentation

## 2015-05-05 DIAGNOSIS — F17210 Nicotine dependence, cigarettes, uncomplicated: Secondary | ICD-10-CM | POA: Insufficient documentation

## 2015-05-05 NOTE — Discharge Instructions (Signed)
Priapism °Priapism is an unwanted erection of the penis that usually develops without sexual stimulation or desire. Priapism affects males of all ages. There are three types of priapism: °· Recurrent acute priapism. With this type, erections are painful and last less than 3 hours. The erections come and go. °· Acute prolonged priapism. With this type, erections are painful and last hours to days. This type can lead to erectile dysfunction. °· Persistent priapism. With this type, erections are usually painless and can last weeks to years. The penis gets erect but not rigid. This type can lead to erectile dysfunction. °CAUSES °This condition develops either when blood has difficulty leaving the penis (low-flow priapism) or if too much blood flows into the penis (high-flow priapism). Blood flow issues may be caused by: °· Erectile dysfunction medicine. This is the most common cause. °· Medicine that is used to treat depression and anxiety. °· Blood problems that are common in people who have sickle cell disease or leukemia. °· Use of illegal drugs, such as cocaine and marijuana. °· Excessive alcohol use. °· Neurological problems, such as multiple sclerosis. °· Diabetes mellitus. °· An injury to the penis. °· An infection. °In some cases, the cause may not be known. °SYMPTOMS °Symptoms of this condition include: °· A prolonged erection. °· A painful erection. °DIAGNOSIS °This condition is diagnosed with a physical exam. Blood tests may be done to help identify the cause of the condition. °TREATMENT °Treatment for this condition depends on the cause and the type of priapism. Recurrent acute priapism is often managed at home. Acute prolonged priapism is usually treated at a hospital, where treatment may involve: °· Getting fluid and medicines for pain through an IV tube. °· A blood transfusion. °· A procedure to drain blood from the penis. °· Surgery to make a passageway for blood to flow in the penis (surgical  shunting). °No standard treatment exists for persistent priapism. °HOME CARE INSTRUCTIONS °Managing Recurrent Priapism °· Try taking a warm bath or exercising. °· Keep track of how long your erection lasts. If it does not go away in 3 hours, seek medical care. °General Instructions °· Avoid sexual stimulation and intercourse until your health care provider says that they are okay for you. °· Avoid drugs or alcohol if they caused the priapism. Avoiding them can help to prevent the condition from coming back. °· Drink enough fluid to keep your urine clear or pale yellow. °· Empty your bladder as much as possible. °· Take over-the-counter and prescription medicines only as told by your health care provider. °· Do not take any medicines during an episode unless you get approval from your health care provider. °· Keep all follow-up visits as told by your health care provider. This is important. °SEEK MEDICAL CARE IF: °· Your pain gets worse. °· Your pain does not improve with treatment. °· You have recurrent priapism and your erection does not go away in 3 hours. °SEEK IMMEDIATE MEDICAL CARE IF: °· You have a fever or chills. °· You have pain, swelling, or redness in your genitals or your groin area. °  °This information is not intended to replace advice given to you by your health care provider. Make sure you discuss any questions you have with your health care provider. °  °Document Released: 08/04/2003 Document Revised: 02/02/2015 Document Reviewed: 08/17/2014 °Elsevier Interactive Patient Education ©2016 [employer] ° °

## 2015-05-05 NOTE — ED Notes (Signed)
Pt c/o erection lasting longer than 4 hours-- states it is starting to "go down now" hx of same

## 2015-05-05 NOTE — ED Provider Notes (Signed)
CSN: [id number]     Arrival date & time 05/05/15  [id number] History   First MD Initiated Contact with Patient 05/05/15 [phone]     Chief Complaint  Patient presents with  . priapism      (Consider location/radiation/quality/duration/timing/severity/associated sxs/prior Treatment) HPI Comments: Priapism since 2AM 7 hours Resolved on arrival to ED room Feels sore now, feels pressure, mild, decreased (was severe) Was masturbating trying to get it to go away No medications, no drugs, no hx of sickle cell Hx of prior priapism, unknown etiology, approx 5 times, has required aspiration/injections in the past    Past Medical History  Diagnosis Date  . Hypertension   . ETOH abuse   . Priapism    History reviewed. No pertinent past surgical history. No family history on file. Social History  Substance Use Topics  . Smoking status: Current Every Day Smoker -- 1.00 packs/day    Types: Cigarettes  . Smokeless tobacco: None  . Alcohol Use: Yes     Comment: drinks 2 40 ounces a day    Review of Systems  Constitutional: Negative for fever.  HENT: Negative for sore throat.   Eyes: Negative for visual disturbance.  Respiratory: Negative for shortness of breath.   Cardiovascular: Negative for chest pain.  Gastrointestinal: Negative for abdominal pain.  Genitourinary: Positive for penile swelling and penile pain. Negative for dysuria, urgency, discharge, difficulty urinating and testicular pain.  Musculoskeletal: Negative for back pain and neck stiffness.  Skin: Negative for rash.  Neurological: Negative for syncope and headaches.      Allergies  Review of patient's allergies indicates no known allergies.  Home Medications   Prior to Admission medications   Medication Sig Start Date End Date Taking? Authorizing Provider  hydrochlorothiazide (HYDRODIURIL) 25 MG tablet Take 1 tablet (25 mg total) by mouth daily. 12/08/14   Gwyneth SproutWhitney Plunkett, MD  lisinopril (PRINIVIL,ZESTRIL) 10 MG tablet  Take 1 tablet (10 mg total) by mouth daily. 12/08/14   Gwyneth SproutWhitney Plunkett, MD  naproxen (NAPROSYN) 250 MG tablet Take 250 mg by mouth once.    Historical Provider, MD   BP 162/96 mmHg  Pulse 87  Temp(Src) 97.5 F (36.4 C) (Oral)  Resp 16  Ht 6' (1.829 m)  Wt 265 lb (120.203 kg)  BMI 35.93 kg/m2  SpO2 100% Physical Exam  Constitutional: He is oriented to person, place, and time. He appears well-developed and well-nourished. No distress.  HENT:  Head: Normocephalic and atraumatic.  Eyes: Conjunctivae and EOM are normal.  Neck: Normal range of motion.  Cardiovascular: Normal rate, regular rhythm, normal heart sounds and intact distal pulses.  Exam reveals no gallop and no friction rub.   No murmur heard. Pulmonary/Chest: Effort normal and breath sounds normal. No respiratory distress. He has no wheezes. He has no rales.  Abdominal: Soft. He exhibits no distension. There is no tenderness. There is no guarding.  Genitourinary: Right testis shows no tenderness. Left testis shows no tenderness. Penile tenderness (pt reports mild soreness) present.  Musculoskeletal: He exhibits no edema.  Neurological: He is alert and oriented to person, place, and time.  Skin: Skin is warm and dry. He is not diaphoretic.  Nursing note and vitals reviewed.   ED Course  Procedures (including critical care time) Labs Review Labs Reviewed - No data to display  Imaging Review No results found. I have personally reviewed and evaluated these images and lab results as part of my medical decision-making.   EKG Interpretation None  MDM   Final diagnoses:  None   39yo male with history of priapism, htn presents with concern for priapism.  Pt reports unclear etiology of symptoms in the past, however has required aspiration.  Pt with detumescence on initial exam.  Observed in the ED without return of symptoms. Patient discharged in stable condition with understanding of reasons to return.     Alvira Monday, MD 05/05/15 2156

## 2015-06-11 ENCOUNTER — Emergency Department
Admission: EM | Admit: 2015-06-11 | Discharge: 2015-06-11 | Disposition: A | Discharge status: Home / Self Care | Payer: Self-pay | Attending: Emergency Medicine | Admitting: Emergency Medicine

## 2015-06-11 ENCOUNTER — Emergency Department: Payer: Self-pay

## 2015-06-11 DIAGNOSIS — S0990XA Unspecified injury of head, initial encounter: Secondary | ICD-10-CM | POA: Insufficient documentation

## 2015-06-11 DIAGNOSIS — F17210 Nicotine dependence, cigarettes, uncomplicated: Secondary | ICD-10-CM | POA: Insufficient documentation

## 2015-06-11 DIAGNOSIS — Y9289 Other specified places as the place of occurrence of the external cause: Secondary | ICD-10-CM | POA: Insufficient documentation

## 2015-06-11 DIAGNOSIS — W2209XA Striking against other stationary object, initial encounter: Secondary | ICD-10-CM | POA: Insufficient documentation

## 2015-06-11 DIAGNOSIS — Z7982 Long term (current) use of aspirin: Secondary | ICD-10-CM | POA: Insufficient documentation

## 2015-06-11 DIAGNOSIS — S01312A Laceration without foreign body of left ear, initial encounter: Secondary | ICD-10-CM | POA: Insufficient documentation

## 2015-06-11 DIAGNOSIS — Y9389 Activity, other specified: Secondary | ICD-10-CM | POA: Insufficient documentation

## 2015-06-11 DIAGNOSIS — Y998 Other external cause status: Secondary | ICD-10-CM | POA: Insufficient documentation

## 2015-06-11 DIAGNOSIS — Z79899 Other long term (current) drug therapy: Secondary | ICD-10-CM | POA: Insufficient documentation

## 2015-06-11 DIAGNOSIS — I10 Essential (primary) hypertension: Secondary | ICD-10-CM | POA: Insufficient documentation

## 2015-06-11 NOTE — Discharge Instructions (Signed)
Nonsutured Laceration Care °A laceration is a cut that goes through all layers of the skin and extends into the tissue that is right under the skin. This type of cut is usually stitched up (sutured) or closed with tape (adhesive strips) or skin glue shortly after the injury happens. °However, if the wound is dirty or if several hours pass before medical treatment is provided, it is likely that germs (bacteria) will enter the wound. Closing a laceration after bacteria have entered it increases the risk of infection. In these cases, your health care provider may leave the laceration open (nonsutured) and cover it with a bandage. This type of treatment helps prevent infection and allows the wound to heal from the deepest layer of tissue damage up to the surface. °An open fracture is a type of injury that may involve nonsutured lacerations. An open fracture is a break in a bone that happens along with one or more lacerations through the skin that is near the fracture site. °HOW TO CARE FOR YOUR NONSUTURED LACERATION °· Take or apply over-the-counter and prescription medicines only as told by your health care provider. °· If you were prescribed an antibiotic medicine, take or apply it as told by your health care provider. Do not stop using the antibiotic even if your condition improves. °· Clean the wound one time each day or as told by your health care provider. °¨ Wash the wound with mild soap and water. °¨ Rinse the wound with water to remove all soap. °¨ Pat your wound dry with a clean towel. Do not rub the wound. °· Do not inject anything into the wound unless your health care provider told you to. °· Change any bandages (dressings) as told by your health care provider. This includes changing the dressing if it gets wet, dirty, or starts to smell bad. °· Keep the dressing dry until your health care provider says it can be removed. Do not take baths, swim, or do anything that puts your wound underwater until your  health care provider approves. °· Raise (elevate) the injured area above the level of your heart while you are sitting or lying down, if possible. °· Do not scratch or pick at the wound. °· Check your wound every day for signs of infection. Watch for: °¨ Redness, swelling, or pain. °¨ Fluid, blood, or pus. °· Keep all follow-up visits as told by your health care provider. This is important. °SEEK MEDICAL CARE IF: °· You received a tetanus and shot and you have swelling, severe pain, redness, or bleeding at the injection site.   °· You have a fever. °· Your pain is not controlled with medicine. °· You have increased redness, swelling, or pain at the site of your wound. °· You have fluid, blood, or pus coming from your wound. °· You notice a bad smell coming from your wound or your dressing. °· You notice something coming out of the wound, such as wood or glass. °· You notice a change in the color of your skin near your wound. °· You develop a new rash. °· You need to change the dressing frequently due to fluid, blood, or pus draining from the wound. °· You develop numbness around your wound. °SEEK IMMEDIATE MEDICAL CARE IF: °· Your pain suddenly increases and is severe. °· You develop severe swelling around the wound. °· The wound is on your hand or foot and you cannot properly move a finger or toe. °· The wound is on your hand or   foot and you notice that your fingers or toes look pale or bluish. °· You have a red streak going away from your wound. °  °This information is not intended to replace advice given to you by your health care provider. Make sure you discuss any questions you have with your health care provider. °  °Document Released: 04/11/2006 Document Revised: 09/28/2014 Document Reviewed: 05/10/2014 °Elsevier Interactive Patient Education ©2016 [employer] ° °
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
## 2016-02-21 ENCOUNTER — Encounter: Payer: Self-pay | Admitting: Emergency Medicine

## 2016-02-21 ENCOUNTER — Emergency Department
Admission: EM | Admit: 2016-02-21 | Discharge: 2016-02-21 | Disposition: A | Discharge status: Home / Self Care | Payer: Self-pay | Attending: Emergency Medicine | Admitting: Emergency Medicine

## 2016-02-21 DIAGNOSIS — N342 Other urethritis: Secondary | ICD-10-CM | POA: Insufficient documentation

## 2016-02-21 DIAGNOSIS — Z7982 Long term (current) use of aspirin: Secondary | ICD-10-CM | POA: Insufficient documentation

## 2016-02-21 DIAGNOSIS — F17210 Nicotine dependence, cigarettes, uncomplicated: Secondary | ICD-10-CM | POA: Insufficient documentation

## 2016-02-21 DIAGNOSIS — R55 Syncope and collapse: Secondary | ICD-10-CM | POA: Insufficient documentation

## 2016-02-21 DIAGNOSIS — I10 Essential (primary) hypertension: Secondary | ICD-10-CM | POA: Insufficient documentation

## 2016-02-21 LAB — CBC
HEMATOCRIT: 43.7 % (ref 40.0–52.0)
Hemoglobin: 15.3 g/dL (ref 13.0–18.0)
MCH: 33.2 pg (ref 26.0–34.0)
MCHC: 35.1 g/dL (ref 32.0–36.0)
MCV: 94.7 fL (ref 80.0–100.0)
Platelets: 241 10*3/uL (ref 150–440)
RBC: 4.61 MIL/uL (ref 4.40–5.90)
RDW: 13.9 % (ref 11.5–14.5)
WBC: 7.3 10*3/uL (ref 3.8–10.6)

## 2016-02-21 LAB — URINALYSIS COMPLETE WITH MICROSCOPIC (ARMC ONLY)
BACTERIA UA: NONE SEEN
Bilirubin Urine: NEGATIVE
GLUCOSE, UA: NEGATIVE mg/dL
NITRITE: NEGATIVE
Protein, ur: 100 mg/dL — AB
Specific Gravity, Urine: 1.029 (ref 1.005–1.030)
pH: 5 (ref 5.0–8.0)

## 2016-02-21 LAB — BASIC METABOLIC PANEL
Anion gap: 8 (ref 5–15)
BUN: 8 mg/dL (ref 6–20)
CO2: 23 mmol/L (ref 22–32)
Calcium: 9.1 mg/dL (ref 8.9–10.3)
Chloride: 105 mmol/L (ref 101–111)
Creatinine, Ser: 0.94 mg/dL (ref 0.61–1.24)
GFR calc Af Amer: >60 mL/min (ref >60)
GLUCOSE: 116 mg/dL — AB (ref 65–99)
POTASSIUM: 3.8 mmol/L (ref 3.5–5.1)
Sodium: 136 mmol/L (ref 135–145)

## 2016-02-21 LAB — TROPONIN I

## 2016-02-21 LAB — CHLAMYDIA/NGC RT PCR (ARMC ONLY)
CHLAMYDIA TR: NOT DETECTED
N GONORRHOEAE: NOT DETECTED

## 2016-02-21 MED ORDER — HYDROCHLOROTHIAZIDE 25 MG PO TABS: 25.0000 mg | ORAL_TABLET | Freq: Every day | ORAL | 0 refills | Status: DC

## 2016-02-21 MED ORDER — AZITHROMYCIN 1 G PO PACK
1.0000 g | PACK | Freq: Once | ORAL | Status: AC
  Administered 2016-02-21: 1 g via ORAL
  Filled 2016-02-21 (×2): qty 1

## 2016-02-21 MED ORDER — LISINOPRIL 10 MG PO TABS: 10.0000 mg | ORAL_TABLET | Freq: Every day | ORAL | 11 refills | Status: DC

## 2016-02-21 MED ORDER — CEFTRIAXONE SODIUM 250 MG IJ SOLR
250.0000 mg | Freq: Once | INTRAMUSCULAR | Status: AC
  Administered 2016-02-21: 250 mg via INTRAMUSCULAR
  Filled 2016-02-21: qty 250

## 2016-02-21 NOTE — ED Notes (Signed)
PT. States LOC at work today followed by diaphoresis and headache. Took 2 [employer] and headache resolved. Pt. Thought was BP so came to ED for evaluation. Pt. Denies sx that presented earlier and feels fine now. Pt. Denies recent diet or sleep changes but reports limited sleep due to work and home life.

## 2016-02-21 NOTE — ED Provider Notes (Signed)
[facility] Emergency Department Provider Note   ____________________________________________   First MD Initiated Contact with Patient 02/21/16 1846     (approximate)  I have reviewed the triage vital signs and the nursing notes.   HISTORY  Chief Complaint Hypertension   HPI Bill Hammond is a 40 y.o. male with a history of hypertension who has been off of his hypertension medications since March is presenting after a near syncopal episode. He says that he woke up this morning with a mild headache to the right side of his head. The headache increased over the course the day and he did not drink as much as he normally would. The patient said that he works in a very hot setting and his job and he was sweating profusely and standing up for about 30 minutes when he said that his coworker said that he stared blankly into space. He did not fall. He was able to cover from the episode. Prior to the episode because of his increasing headache he took 2 BC powders and after the episode and at this time he he is headache free. He says that he is feeling back to his baseline at this time. Has never passed out before. Says that he is supposed to be on lisinopril 10 mg as well as HCTZ 25 mg but currently does not have any insurance and is on his medications. He says that he should be getting insurance if the 90 day waiting period for his current job. He denies any chest pain or shortness of breath.   Past Medical History:  Diagnosis Date  . ETOH abuse   . Hypertension   . Priapism     Patient Active Problem List   Diagnosis Date Noted  . Priapism     History reviewed. No pertinent surgical history.  Prior to Admission medications   Medication Sig Start Date End Date Taking? Authorizing Provider  aspirin 325 MG tablet Take 325 mg by mouth daily as needed for mild pain.    Historical Provider, MD  hydrochlorothiazide (HYDRODIURIL) 25 MG tablet Take 1 tablet (25 mg  total) by mouth daily. 12/08/14   Gwyneth Sprout, MD  lisinopril (PRINIVIL,ZESTRIL) 10 MG tablet Take 1 tablet (10 mg total) by mouth daily. 12/08/14   Gwyneth Sprout, MD    Allergies Review of patient's allergies indicates no known allergies.  No family history on file.  Social History Social History  Substance Use Topics  . Smoking status: Current Every Day Smoker    Packs/day: 1.00    Types: Cigarettes  . Smokeless tobacco: Never Used  . Alcohol use Yes     Comment: drinks 2 40 ounces a day    Review of Systems Constitutional: No fever/chills Eyes: No visual changes. ENT: No sore throat. Cardiovascular: Denies chest pain. Respiratory: Denies shortness of breath. Gastrointestinal: No abdominal pain.  No nausea, no vomiting.  No diarrhea.  No constipation. Genitourinary: Negative for dysuria. Musculoskeletal: Negative for back pain. Skin: Negative for rash. Neurological: Negative for focal weakness or numbness.  10-point ROS otherwise negative.  ____________________________________________   PHYSICAL EXAM:  VITAL SIGNS: ED Triage Vitals  Enc Vitals Group     BP 02/21/16 1818 (!) 155/98     Pulse Rate 02/21/16 1818 89     Resp 02/21/16 1818 20     Temp 02/21/16 1818 98.1 F (36.7 C)     Temp Source 02/21/16 1818 Oral     SpO2 02/21/16 1818 96 %  Weight 02/21/16 1815 250 lb (113.4 kg)     Height 02/21/16 1815 6' (1.829 m)     Head Circumference --      Peak Flow --      Pain Score 02/21/16 1815 3     Pain Loc --      Pain Edu? --      Excl. in GC? --     Constitutional: Alert and oriented. Well appearing and in no acute distress. Eyes: Conjunctivae are normal. PERRL. EOMI. Head: Atraumatic. Nose: No congestion/rhinnorhea. Mouth/Throat: Mucous membranes are moist.  Neck: No stridor.   Cardiovascular: Normal rate, regular rhythm. Grossly normal heart sounds.   Respiratory: Normal respiratory effort.  No retractions. Lungs CTAB. Gastrointestinal:  Soft and nontender. No distention.  Musculoskeletal: No lower extremity tenderness nor edema.  No joint effusions. Neurologic:  Normal speech and language. No gross focal neurologic deficits are appreciated. No gait instability. Skin:  Skin is warm, dry and intact. No rash noted. Psychiatric: Mood and affect are normal. Speech and behavior are normal.  ____________________________________________   LABS (all labs ordered are listed, but only abnormal results are displayed)  Labs Reviewed  BASIC METABOLIC PANEL - Abnormal; Notable for the following:       Result Value   Glucose, Bld 116 (*)    All other components within normal limits  URINALYSIS COMPLETEWITH MICROSCOPIC ([facility] ONLY) - Abnormal; Notable for the following:    Color, Urine YELLOW (*)    APPearance CLEAR (*)    Ketones, ur TRACE (*)    Hgb urine dipstick 2+ (*)    Protein, ur 100 (*)    Leukocytes, UA TRACE (*)    Squamous Epithelial / LPF 0-5 (*)    All other components within normal limits  CBC  TROPONIN I   ____________________________________________  EKG  ED ECG REPORT I, Arelia Longest, the attending physician, personally viewed and interpreted this ECG.   Date: 02/21/2016  EKG Time: 1828  Rate: 85  Rhythm: normal sinus rhythm  Axis: Normal  Intervals:none  ST&T Change: No ST segment elevation or depression. No abnormal T-wave inversion.  ____________________________________________  RADIOLOGY   ____________________________________________   PROCEDURES  Procedure(s) performed:   Procedures  Critical Care performed:   ____________________________________________   INITIAL IMPRESSION / ASSESSMENT AND PLAN / ED COURSE  Pertinent labs & imaging results that were available during my care of the patient were reviewed by me and considered in my medical decision making (see chart for details).  ----------------------------------------- 8:09 PM on  02/21/2016 -----------------------------------------  Patient with very reassuring blood work and possibly infection in his urine. He says that he is concerned that his partner may be cheating on him and would like to be covered for STDs. We are pending his gonorrhea and chlamydia as well as urine cultures at this time. We'll prophylactically treat. Patient likely with vasovagal episode. We'll also restart blood pressure medications. Patient is to stay hydrated. Will give strict instructions for no sexual contact until he is fully cleared to make sure that he does not have any confirmed STDs. Patient understands the plan and is willing to comply. Will be discharged home.  Clinical Course     ____________________________________________   FINAL CLINICAL IMPRESSION(S) / ED DIAGNOSES  Urethritis. Near-syncope.    NEW MEDICATIONS STARTED DURING THIS VISIT:  New Prescriptions   No medications on file     Note:  This document was prepared using Dragon voice recognition software and may include unintentional  dictation errors.    Myrna Blazeravid Matthew Alisa Stjames, MD 02/21/16 2011

## 2016-02-21 NOTE — ED Triage Notes (Signed)
States while he was working  He had a near syncopal episode  Became diaphoretic with slight headache   Hx of htn but has been out of meds for about 5 -6 month

## 2016-02-23 LAB — URINE CULTURE: Culture: NO GROWTH

## 2016-10-31 ENCOUNTER — Emergency Department
Admission: EM | Admit: 2016-10-31 | Discharge: 2016-10-31 | Disposition: A | Discharge status: Home / Self Care | Payer: Self-pay | Attending: Student in an Organized Health Care Education/Training Program | Admitting: Student in an Organized Health Care Education/Training Program

## 2016-10-31 DIAGNOSIS — F17210 Nicotine dependence, cigarettes, uncomplicated: Secondary | ICD-10-CM | POA: Insufficient documentation

## 2016-10-31 DIAGNOSIS — I10 Essential (primary) hypertension: Secondary | ICD-10-CM | POA: Insufficient documentation

## 2016-10-31 DIAGNOSIS — M5442 Lumbago with sciatica, left side: Secondary | ICD-10-CM | POA: Insufficient documentation

## 2016-10-31 MED ORDER — ORPHENADRINE CITRATE 30 MG/ML IJ SOLN
60.0000 mg | Freq: Two times a day (BID) | INTRAMUSCULAR | Status: DC
  Administered 2016-10-31: 60 mg via INTRAMUSCULAR

## 2016-10-31 MED ORDER — METHYLPREDNISOLONE SODIUM SUCC 125 MG IJ SOLR
125.0000 mg | Freq: Once | INTRAMUSCULAR | Status: AC
  Administered 2016-10-31: 125 mg via INTRAMUSCULAR
  Filled 2016-10-31: qty 2

## 2016-10-31 MED ORDER — PREDNISONE 10 MG (21) PO TBPK: ORAL_TABLET | Freq: Every day | ORAL | 0 refills | Status: AC

## 2016-10-31 MED ORDER — ORPHENADRINE CITRATE 30 MG/ML IJ SOLN
INTRAMUSCULAR | Status: AC
  Administered 2016-10-31: 60 mg via INTRAMUSCULAR
  Filled 2016-10-31: qty 2

## 2016-10-31 NOTE — ED Provider Notes (Signed)
[facility] Emergency Department Provider Note  ____________________________________________  Time seen: Approximately 9:28 PM  I have reviewed the triage vital signs and the nursing notes.   HISTORY  Chief Complaint Back Pain    HPI Bill Hammond is a 41 y.o. male presenting to the emergency department with 10/10 aching low back pain low back pain that radiates into the left buttocks for the past week. Patient denies falls or incidences of trauma. Patient denies prior surgeries to the low back. Patient denies weakness, paresthesias, radiculopathy, coldness of the lower extremities or bowel or bladder incontinence. No other alleviating measures have been attempted. Patient denies dysuria, increased urinary frequency, hematuria or bilateral flank pain.   Past Medical History:  Diagnosis Date  . ETOH abuse   . Hypertension   . Priapism     Patient Active Problem List   Diagnosis Date Noted  . Priapism     History reviewed. No pertinent surgical history.  Prior to Admission medications   Medication Sig Start Date End Date Taking? Authorizing Provider  aspirin 325 MG tablet Take 325 mg by mouth daily as needed for mild pain.    [provider]  hydrochlorothiazide (HYDRODIURIL) 25 MG tablet Take 1 tablet (25 mg total) by mouth daily. 02/21/16   Schaevitz, Myra Rudeavid Matthew, MD  lisinopril (PRINIVIL,ZESTRIL) 10 MG tablet Take 1 tablet (10 mg total) by mouth daily. 02/21/16 02/20/17  Myrna BlazerSchaevitz, David Matthew, MD  predniSONE (STERAPRED UNI-PAK 21 TAB) 10 MG (21) TBPK tablet Take by mouth daily. Take 6 tablets the first day, take 5 tablets the second day, take 4 tablets the third day, take 3 tablets the fourth day, take 2 tablets the fifth day, take 1 tablet the sixth day. 10/31/16 11/06/16  Orvil FeilWoods, Mickelle Goupil M, PA-C    Allergies Patient has no known allergies.  No family history on file.  Social History Social History  Substance Use Topics  . Smoking  status: Current Every Day Smoker    Packs/day: 1.00    Types: Cigarettes  . Smokeless tobacco: Never Used  . Alcohol use Yes     Comment: drinks 2 40 ounces a day     Review of Systems  Constitutional: No fever/chills Eyes: No visual changes. No discharge ENT: No upper respiratory complaints. Cardiovascular: no chest pain. Respiratory: no cough. No SOB. Gastrointestinal: No abdominal pain.  No nausea, no vomiting.  No diarrhea.  No constipation. Genitourinary: Negative for dysuria. No hematuria Musculoskeletal: Patient has low back pain.  Skin: Negative for rash, abrasions, lacerations, ecchymosis. Neurological: Negative for headaches, focal weakness or numbness. ____________________________________________   PHYSICAL EXAM:  VITAL SIGNS: ED Triage Vitals  Enc Vitals Group     BP 10/31/16 2117 (!) 149/78     Pulse Rate 10/31/16 2117 89     Resp 10/31/16 2117 18     Temp 10/31/16 2117 98.2 F (36.8 C)     Temp Source 10/31/16 2117 Oral     SpO2 10/31/16 2117 98 %     Weight 10/31/16 2113 256 lb (116.1 kg)     Height 10/31/16 2113 6' (1.829 m)     Head Circumference --      Peak Flow --      Pain Score 10/31/16 2113 10     Pain Loc --      Pain Edu? --      Excl. in GC? --     Constitutional: Alert and oriented. Well appearing and in no acute distress.  Eyes: Conjunctivae are normal. PERRL. EOMI. Head: Atraumatic. Cardiovascular: Normal rate, regular rhythm. Normal S1 and S2.  Good peripheral circulation. Respiratory: Normal respiratory effort without tachypnea or retractions. Lungs CTAB. Good air entry to the bases with no decreased or absent breath sounds. Musculoskeletal: Patient has 5 out of 5 strength in the upper and lower extremities bilaterally. Patient has no tenderness along the lumbar, thoracic or cervical spine regions. Positive straight leg raise test, left. Palpable dorsalis pedis pulses bilaterally and symmetrically. Neurologic:  Normal speech and  language. No gross focal neurologic deficits are appreciated.  Skin:  Skin is warm, dry and intact. No rash noted. Psychiatric: Mood and affect are normal. Speech and behavior are normal. Patient exhibits appropriate insight and judgement.   ____________________________________________   LABS (all labs ordered are listed, but only abnormal results are displayed)  Labs Reviewed - No data to display ____________________________________________  EKG   ____________________________________________  RADIOLOGY   No results found.  ____________________________________________    PROCEDURES  Procedure(s) performed:    Procedures    Medications  orphenadrine (NORFLEX) injection 60 mg (60 mg Intramuscular Given 10/31/16 2140)  methylPREDNISolone sodium succinate (SOLU-MEDROL) 125 mg/2 mL injection 125 mg (125 mg Intramuscular Given 10/31/16 2140)     ____________________________________________   INITIAL IMPRESSION / ASSESSMENT AND PLAN / ED COURSE  Pertinent labs & imaging results that were available during my care of the patient were reviewed by me and considered in my medical decision making (see chart for details).  Review of the [place] CSRS was performed in accordance of the [facility] prior to dispensing any controlled drugs.     Assessment and plan: Low back pain: Patient presents to the emergency department with low back pain that radiates into the left buttocks. Patient had no falls or traumas. Further workup with a DG lumbar spine was not warranted at this time. Patient was given an injection of Norflex and Solu-Medrol in the emergency department. He was discharged with tapered prednisone. Vital signs were reassuring prior to discharge. Patient was advised to follow-up with primary care as needed if back pain persist. All patient questions were answered.     ____________________________________________  FINAL CLINICAL IMPRESSION(S) / ED DIAGNOSES  Final diagnoses:   Acute bilateral low back pain with left-sided sciatica      NEW MEDICATIONS STARTED DURING THIS VISIT:  New Prescriptions   PREDNISONE (STERAPRED UNI-PAK 21 TAB) 10 MG (21) TBPK TABLET    Take by mouth daily. Take 6 tablets the first day, take 5 tablets the second day, take 4 tablets the third day, take 3 tablets the fourth day, take 2 tablets the fifth day, take 1 tablet the sixth day.        This chart was dictated using voice recognition software/Dragon. Despite best efforts to proofread, errors can occur which can change the meaning. Any change was purely unintentional.    Orvil Feil, PA-C 10/31/16 2142    Willy Eddy, MD 10/31/16 2312

## 2016-12-19 ENCOUNTER — Emergency Department
Admission: EM | Admit: 2016-12-19 | Discharge: 2016-12-19 | Disposition: A | Discharge status: Home / Self Care | Payer: Self-pay | Attending: Emergency Medicine | Admitting: Emergency Medicine

## 2016-12-19 ENCOUNTER — Encounter: Payer: Self-pay | Admitting: Emergency Medicine

## 2016-12-19 DIAGNOSIS — N4830 Priapism, unspecified: Secondary | ICD-10-CM | POA: Insufficient documentation

## 2016-12-19 DIAGNOSIS — N4833 Priapism, drug-induced: Secondary | ICD-10-CM | POA: Insufficient documentation

## 2016-12-19 DIAGNOSIS — Z7982 Long term (current) use of aspirin: Secondary | ICD-10-CM | POA: Insufficient documentation

## 2016-12-19 DIAGNOSIS — Z79899 Other long term (current) drug therapy: Secondary | ICD-10-CM | POA: Insufficient documentation

## 2016-12-19 DIAGNOSIS — F17210 Nicotine dependence, cigarettes, uncomplicated: Secondary | ICD-10-CM | POA: Insufficient documentation

## 2016-12-19 DIAGNOSIS — I10 Essential (primary) hypertension: Secondary | ICD-10-CM | POA: Insufficient documentation

## 2016-12-19 MED ORDER — PHENYLEPHRINE 200 MCG/ML FOR PRIAPISM / HYPOTENSION: 100.0000 ug | INTRAMUSCULAR | Status: DC | PRN

## 2016-12-19 MED ORDER — HYDROMORPHONE HCL 1 MG/ML IJ SOLN
0.5000 mg | Freq: Once | INTRAMUSCULAR | Status: AC
  Administered 2016-12-19: 0.5 mg via INTRAVENOUS
  Filled 2016-12-19: qty 1

## 2016-12-19 MED ORDER — PHENYLEPHRINE 200 MCG/ML FOR PRIAPISM / HYPOTENSION
500.0000 ug | Freq: Once | INTRAMUSCULAR | Status: AC
  Administered 2016-12-19: 500 ug via INTRACAVERNOUS
  Filled 2016-12-19: qty 50

## 2016-12-19 MED ORDER — ONDANSETRON HCL 4 MG/2ML IJ SOLN
4.0000 mg | Freq: Once | INTRAMUSCULAR | Status: AC
  Administered 2016-12-19: 4 mg via INTRAVENOUS
  Filled 2016-12-19: qty 2

## 2016-12-19 NOTE — ED Triage Notes (Signed)
Arrives with c/p full erection x 3 1/2 hours.  States has history of priapism, symptoms usually related to drinking of non compliance with blood pressure medicine.  Patient states last took blood pressure medication in April.

## 2016-12-19 NOTE — Discharge Instructions (Signed)
Please follow-up with Dr. Apolinar JunesBrandon a urologist for your priapism. Please follow-up with primary care to keep her blood pressure. I know you're taking several blood pressure medications before. I'm going to start you on hydrochlorothiazide or HCTZ 25 mg once a day. Follow-up with primary care to see if you need more. I would try [facility] care or [facility] or [facility] or [facility] or the [facility] or the [facility] or [facility] care.

## 2016-12-19 NOTE — ED Provider Notes (Signed)
[name] Emergency Department Provider Note   ____________________________________________   First MD Initiated Contact with Patient 12/19/16 1649     (approximate)  I have reviewed the triage vital signs and the nursing notes.   HISTORY  Chief Complaint priaprism   HPI Bill Hammond is a 41 y.o. male who reports at least his third episode of priapism. He said last time he was here he got a shot and it made it go down a shot in his arm. He wants to try that again before he gets another injection in his penis. Reviewing the old records it appears that he got a shot of pain medicine we will try some some low-dose of Dilaudid with Zofran to see if that does anything and the urologist in the meantime has been contacted and asked pharmacy to bring up some phenylephrine in a bottle which they're doing. At this point is been about 3 hours and 15 minutes of priapism.   Past Medical History:  Diagnosis Date  . ETOH abuse   . Hypertension   . Priapism     Patient Active Problem List   Diagnosis Date Noted  . Priapism     History reviewed. No pertinent surgical history.  Prior to Admission medications   Medication Sig Start Date End Date Taking? Authorizing Provider  aspirin 325 MG tablet Take 325 mg by mouth daily as needed for mild pain.    [provider]  hydrochlorothiazide (HYDRODIURIL) 25 MG tablet Take 1 tablet (25 mg total) by mouth daily. 02/21/16   Schaevitz, Myra Rudeavid Matthew, MD  lisinopril (PRINIVIL,ZESTRIL) 10 MG tablet Take 1 tablet (10 mg total) by mouth daily. 02/21/16 02/20/17  Schaevitz, Myra Rudeavid Matthew, MD    Allergies Patient has no known allergies.  No family history on file.  Social History Social History  Substance Use Topics  . Smoking status: Current Every Day Smoker    Packs/day: 1.00    Types: Cigarettes  . Smokeless tobacco: Never Used  . Alcohol use Yes     Comment: drinks 2 40 ounces a day    Review of  Systems  Constitutional: No fever/chills Eyes: No visual changes. ENT: No sore throat. Cardiovascular: Denies chest pain. Respiratory: Denies shortness of breath. Gastrointestinal: No abdominal pain.  No nausea, no vomiting.  No diarrhea.  No constipation. Genitourinary: Negative for dysuria. Musculoskeletal: Negative for back pain. Skin: Negative for rash. Neurological: Negative for headaches, focal weakness or numbness.   ____________________________________________   PHYSICAL EXAM:  VITAL SIGNS: ED Triage Vitals  Enc Vitals Group     BP 12/19/16 1644 (!) 151/101     Pulse Rate 12/19/16 1644 88     Resp 12/19/16 1644 16     Temp 12/19/16 1644 98.5 Hammond (36.9 C)     Temp Source 12/19/16 1644 Oral     SpO2 12/19/16 1644 98 %     Weight 12/19/16 1644 267 lb (121.1 kg)     Height 12/19/16 1644 6' (1.829 m)     Head Circumference --      Peak Flow --      Pain Score 12/19/16 1643 10     Pain Loc --      Pain Edu? --      Excl. in GC? --     Constitutional: Alert and oriented. Well appearing and in no acute distress. Eyes: Conjunctivae are normal.  Head: Atraumatic. Nose: No congestion/rhinnorhea. Mouth/Throat: Mucous membranes are moist.   Neck: No  stridor. Cardiovascular: Normal rate, regular rhythm.   Good peripheral circulation. Respiratory: Normal respiratory effort.  No retractions.  Gastrointestinal: Soft and nontender. No distention. No abdominal bruits. No CVA tenderness. Genitourinary: Fully erect penis Musculoskeletal: No lower extremity tenderness nor edema.  No joint effusions. Neurologic:  Normal speech and language. No gross focal neurologic deficits are appreciated. No gait instability. Skin:  Skin is warm, dry and intact. No rash noted. Psychiatric: Mood and affect are normal. Speech and behavior are normal.  ____________________________________________   LABS (all labs ordered are listed, but only abnormal results are displayed)  Labs Reviewed -  No data to display ____________________________________________  EKG   ____________________________________________  RADIOLOGY   ____________________________________________   PROCEDURES  Procedure(s) performed:  Procedures  Critical Care performed:   ____________________________________________   INITIAL IMPRESSION / ASSESSMENT AND PLAN / ED COURSE  Pertinent labs & imaging results that were available during my care of the patient were reviewed by me and considered in my medical decision making (see chart for details).  Patient reports he needs a work note for today. Patient reports she was taking lisinopril 5 mg once a day hydrochlorothiazide and one other medicine but he has not had primary care for some time. So he is not taking his antihypertensives anymore. I will give him a prescription for hydrochlorothiazide 25 mg once a day and a list of primary care offices he can try to follow-up with. He knows about the resident's clinic [facility] in other ways to get medical [place]. He is aware the need to cut back or stop his drinking. He knee knows he needs to follow-up with urology as well.      ____________________________________________   FINAL CLINICAL IMPRESSION(S) / ED DIAGNOSES  Final diagnoses:  None      NEW MEDICATIONS STARTED DURING THIS VISIT:  New Prescriptions   No medications on file     Note:  This document was prepared using Dragon voice recognition software and may include unintentional dictation errors.    Bill Hammond, Bill F, MD 12/19/16 [phone]

## 2016-12-19 NOTE — Consult Note (Signed)
Urology Consult  I have been asked to see the patient by Dr. Darnelle CatalanMalinda, for evaluation and management of priapism.  Chief Complaint: Penile pain  History of Present Illness: Bill Hammond is a 41 y.o. year old with a history of recurrent episodes of priapism who presented earlier today to the emergency room after developing a painful erection which failed x 3 hours. He was able to ejaculate which did not help to resolve the erection. He is having no voiding difficulties. He does have a history of priapism with his last occurrence in 2016 requiring phenylephrine injection.   He denies any known history of sickle cell anemia (screen negative). He does have a personal history of substance abuse and continues to use marijuana frequently. He also notes today that he has not seen a primary care physician in quite some time and his blood pressure has been poorly controlled.  At baseline, he denies any erectile dysfunction.    Past Medical History:  Diagnosis Date  . ETOH abuse   . Hypertension   . Priapism     History reviewed. No pertinent surgical history.- patient denies  Home Medications:  Current Meds  Medication Sig  . aspirin 325 MG tablet Take 325 mg by mouth daily as needed for mild pain.  . hydrochlorothiazide (HYDRODIURIL) 25 MG tablet Take 1 tablet (25 mg total) by mouth daily.  Marland Kitchen. lisinopril (PRINIVIL,ZESTRIL) 10 MG tablet Take 1 tablet (10 mg total) by mouth daily.    Allergies: No Known Allergies  No family history on file.  No family history of sickle cell anemia.  Social History:  reports that he has been smoking Cigarettes.  He has been smoking about 1.00 pack per day. He has never used smokeless tobacco. He reports that he drinks alcohol. He reports that he does not use drugs.  ROS: A complete review of systems was performed.  All systems are negative except for pertinent findings as noted.  Physical Exam:  Vital signs in last 24 hours: Temp:  [98.5 F (36.9  C)] 98.5 F (36.9 C) (07/25 1644) Pulse Rate:  [88] 88 (07/25 1644) Resp:  [16] 16 (07/25 1644) BP: (151)/(101) 151/101 (07/25 1644) SpO2:  [98 %] 98 % (07/25 1644) Weight:  [267 lb (121.1 kg)] 267 lb (121.1 kg) (07/25 1644) Constitutional:  Alert and oriented, No acute distress HEENT: [place] AT, moist mucus membranes.  Trachea midline, no masses Cardiovascular: Regular rate and rhythm, no clubbing, cyanosis, or edema. Respiratory: Normal respiratory effort, lungs clear bilaterally GI: Abdomen is soft, nontender, nondistended, no abdominal masses GU: No CVA tenderness.  Fully erect circumcised phallus, tender with manipulation. Scrotum unremarkable, swelling, testicles descended bilaterally without masses. Skin: No rashes, bruises or suspicious lesions Neurologic: Grossly intact, no focal deficits, moving all 4 extremities Psychiatric: Normal mood and affect   Laboratory Data:  Previous urine drug screens and sickle cell reviewed  Radiologic Imaging: N/a  Procedure:   Verbal consent for the procedure was obtained from the patient. Prior to any penile manipulation, he did also receive IV morphine for additional pain control.  The patient was carefully hemodynamically monitored.  A single injection of 300 g of phenylephrine was administered into the patient's left mid corpora after prepping the area. Within a minute or 2, the penis to completely detumesce.  His blood pressure did increase 182/122 but remained asymptomatic. Heart rate decreased slightly.  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 Status post successful injection with phenylephrine was completely detumescence.  Plan:  -Recommend blood pressure observation, treatment as needed if BP fails to improve back to baseline prior to discharge, discussed with Dr. Darnelle CatalanMalinda -Patient is now adequately detumesced, appropriate for  discharge -Patient highly encouraged to follow-up with urology -Counseled to avoid illicit drugs which may possibly be contributing to his recurrent episodes of priapism. -Encouraged patient to establish primary care physician   12/19/2016, 6:00 PM  Vanna ScotlandAshley Tristy Udovich,  MD

## 2016-12-19 NOTE — ED Notes (Signed)
Urology at bedside.

## 2016-12-19 NOTE — ED Notes (Signed)
EDP at bedside  

## 2016-12-19 NOTE — ED Notes (Signed)
States priaprism for 3 hours, states hx of same, states he drank ETOH last night, denies any medication, awake and alert

## 2017-01-08 ENCOUNTER — Encounter: Payer: Self-pay | Admitting: Emergency Medicine

## 2017-01-08 ENCOUNTER — Emergency Department
Admission: EM | Admit: 2017-01-08 | Discharge: 2017-01-08 | Disposition: A | Discharge status: Home / Self Care | Payer: Self-pay | Attending: Emergency Medicine | Admitting: Emergency Medicine

## 2017-01-08 DIAGNOSIS — F17210 Nicotine dependence, cigarettes, uncomplicated: Secondary | ICD-10-CM | POA: Insufficient documentation

## 2017-01-08 DIAGNOSIS — N4830 Priapism, unspecified: Secondary | ICD-10-CM | POA: Insufficient documentation

## 2017-01-08 DIAGNOSIS — Z79899 Other long term (current) drug therapy: Secondary | ICD-10-CM | POA: Insufficient documentation

## 2017-01-08 DIAGNOSIS — Z7982 Long term (current) use of aspirin: Secondary | ICD-10-CM | POA: Insufficient documentation

## 2017-01-08 DIAGNOSIS — I10 Essential (primary) hypertension: Secondary | ICD-10-CM | POA: Insufficient documentation

## 2017-01-08 MED ORDER — PHENYLEPHRINE 200 MCG/ML FOR PRIAPISM / HYPOTENSION
200.0000 ug | Freq: Once | INTRAMUSCULAR | Status: AC
  Administered 2017-01-08: 200 ug via INTRACAVERNOUS
  Filled 2017-01-08: qty 50

## 2017-01-08 MED ORDER — IBUPROFEN 800 MG PO TABS
800.0000 mg | ORAL_TABLET | Freq: Once | ORAL | Status: AC
  Administered 2017-01-08: 800 mg via ORAL
  Filled 2017-01-08: qty 1

## 2017-01-08 MED ORDER — OXYCODONE-ACETAMINOPHEN 5-325 MG PO TABS
1.0000 | ORAL_TABLET | Freq: Once | ORAL | Status: DC
  Filled 2017-01-08: qty 1

## 2017-01-08 NOTE — Progress Notes (Signed)
Patient presented with priapism which he has had for a proximal 7 hours. At approximately 10 PM this evening I discussed with the patient the nature risks benefits and alternatives to aspiration, irrigation and injection of phenylephrine. All questions answered. He said he only needed an injection and did not need irrigation. He said irrigation caused more "scarring" and that would cause him not to be able to get erections. We did discuss risk of penile scarring, erectile dysfunction among others and he elected to proceed. The patient verbally consented to proceed.  Physical exam: On exam the penis was erect with tense corpora that were painful.  Procedure: The right corpora was prepped with alcohol and the short 22-gauge needle (patient request) was inserted and 1 mL of a 200 g per mL phenylephrine solution made up by the pharmacy was injected slowly over a couple of minutes. This provide quick detumescence. The patient was monitored and blood pressure rose to 200/115 but he had no headache, vision changes or chest pain. He remained stable. I discussed with Dr. Derrill KayGoodman.  Priapism-status post injection and resolution. Patient would be able to learn how to do self injections and I encouraged him to follow-up in the outpatient.

## 2017-01-08 NOTE — ED Notes (Signed)
MD updated pt

## 2017-01-08 NOTE — ED Notes (Signed)
Erection has subsided and pt reports feeling "ghost pain" and feeling as though the erection remains but pt reports this is normal for him.

## 2017-01-08 NOTE — ED Notes (Signed)
Pt refusing PO Percocet as ordered by Dr Derrill KayGoodman for pain. Pt got angry, cussing loudly, stating, "they all don't listen to me! I told the doctor I've been through this before and the only thing that works is an IV and Dilaudid. I used to eat Percocets like candy; they're not going to do anything for me". Dr Derrill KayGoodman made aware of pt's refusal of PO Percocet for pain relief.

## 2017-01-08 NOTE — ED Triage Notes (Signed)
Patient ambulatory to triage with steady gait, without difficulty or distress noted; pt reports priapism since 3pm with hx of same; denies any cause

## 2017-01-08 NOTE — Discharge Instructions (Signed)
Please seek medical attention for any high fevers, chest pain, shortness of breath, change in behavior, persistent vomiting, bloody stool or any other new or concerning symptoms.  

## 2017-01-08 NOTE — ED Provider Notes (Signed)
[facility] Emergency Department Provider Note   ____________________________________________   I have reviewed the triage vital signs and the nursing notes.   HISTORY  Chief Complaint Penis Pain   History limited by: Not Limited   HPI Bill Hammond is a 41 y.o. male who presents to the emergency department today because of concern for priapism. Patient has a history of priapism. Has been to the emergency department multiple times and has required injections in the past. Last time was roughly 3 weeks ago. Patient states that today the episode started roughly 6 hours ago. He waited to see if it would go away on its own. He states that it will resolve on its own from time to time. The patient has not followed up with urology since previous episode. He denies any recent illness or other symptoms.   Past Medical History:  Diagnosis Date  . ETOH abuse   . Hypertension   . Priapism     Patient Active Problem List   Diagnosis Date Noted  . Priapism, drug-induced   . Priapism     History reviewed. No pertinent surgical history.  Prior to Admission medications   Medication Sig Start Date End Date Taking? Authorizing Provider  aspirin 325 MG tablet Take 325 mg by mouth daily as needed for mild pain.    [provider]  hydrochlorothiazide (HYDRODIURIL) 25 MG tablet Take 1 tablet (25 mg total) by mouth daily. 02/21/16   Schaevitz, Myra Rude, MD  lisinopril (PRINIVIL,ZESTRIL) 10 MG tablet Take 1 tablet (10 mg total) by mouth daily. 02/21/16 02/20/17  Schaevitz, Myra Rude, MD    Allergies Patient has no known allergies.  No family history on file.  Social History Social History  Substance Use Topics  . Smoking status: Current Every Day Smoker    Packs/day: 1.00    Types: Cigarettes  . Smokeless tobacco: Never Used  . Alcohol use Yes     Comment: drinks 2 40 ounces a day    Review of Systems Constitutional: No fever/chills Eyes: No  visual changes. ENT: No sore throat. Cardiovascular: Denies chest pain. Respiratory: Denies shortness of breath. Gastrointestinal: No abdominal pain.  No nausea, no vomiting.  No diarrhea.   Genitourinary: Positive for priapism.  Musculoskeletal: Negative for back pain. Skin: Negative for rash. Neurological: Negative for headaches, focal weakness or numbness.  ____________________________________________   PHYSICAL EXAM:  VITAL SIGNS: ED Triage Vitals [01/08/17 2042]  Enc Vitals Group     BP (!) 165/103     Pulse Rate 84     Resp 20     Temp 97.9 F (36.6 C)     Temp Source Oral     SpO2 95 %     Weight 263 lb (119.3 kg)     Height 6' (1.829 m)     Head Circumference      Peak Flow      Pain Score 10   Constitutional: Alert and oriented. Well appearing and in no distress. Eyes: Conjunctivae are normal.  ENT   Head: Normocephalic and atraumatic.   Nose: No congestion/rhinnorhea.   Mouth/Throat: Mucous membranes are moist.   Neck: No stridor. Hematological/Lymphatic/Immunilogical: No cervical lymphadenopathy. Cardiovascular: Normal rate, regular rhythm.  No murmurs, rubs, or gallops.  Respiratory: Normal respiratory effort without tachypnea nor retractions. Breath sounds are clear and equal bilaterally. No wheezes/rales/rhonchi. Gastrointestinal: Soft and non tender. No rebound. No guarding.  Genitourinary: Deferred Musculoskeletal: Normal range of motion in all extremities. No lower  extremity edema. Neurologic:  Normal speech and language. No gross focal neurologic deficits are appreciated.  Skin:  Skin is warm, dry and intact. No rash noted. Psychiatric: Mood and affect are normal. Speech and behavior are normal. Patient exhibits appropriate insight and judgment.  ____________________________________________    LABS (pertinent  positives/negatives)  None  ____________________________________________   EKG  None  ____________________________________________    RADIOLOGY  None  ____________________________________________   PROCEDURES  Procedures  ____________________________________________   INITIAL IMPRESSION / ASSESSMENT AND PLAN / ED COURSE  Pertinent labs & imaging results that were available during my care of the patient were reviewed by me and considered in my medical decision making (see chart for details).  Patient presented to the emergency department today because of concerns for priapism. Patient was seen by Dr. Mena GoesEskridge with urology. Dr. Mena GoesEskridge did inject with phenylephrine which did result in detumescence. Will discharge to follow up with urology.  ____________________________________________   FINAL CLINICAL IMPRESSION(S) / ED DIAGNOSES  Final diagnoses:  Priapism     Note: This dictation was prepared with Dragon dictation. Any transcriptional errors that result from this process are unintentional     Phineas SemenGoodman, Jacoria Keiffer, MD 01/08/17 2238

## 2017-01-14 ENCOUNTER — Emergency Department
Admission: EM | Admit: 2017-01-14 | Discharge: 2017-01-14 | Disposition: A | Discharge status: Home / Self Care | Payer: Self-pay | Attending: Emergency Medicine | Admitting: Emergency Medicine

## 2017-01-14 DIAGNOSIS — I10 Essential (primary) hypertension: Secondary | ICD-10-CM | POA: Insufficient documentation

## 2017-01-14 DIAGNOSIS — Z79899 Other long term (current) drug therapy: Secondary | ICD-10-CM | POA: Insufficient documentation

## 2017-01-14 DIAGNOSIS — Z7982 Long term (current) use of aspirin: Secondary | ICD-10-CM | POA: Insufficient documentation

## 2017-01-14 DIAGNOSIS — N4830 Priapism, unspecified: Secondary | ICD-10-CM | POA: Insufficient documentation

## 2017-01-14 DIAGNOSIS — F17210 Nicotine dependence, cigarettes, uncomplicated: Secondary | ICD-10-CM | POA: Insufficient documentation

## 2017-01-14 MED ORDER — PSEUDOEPHEDRINE HCL 60 MG PO TABS
60.0000 mg | ORAL_TABLET | Freq: Once | ORAL | Status: DC
  Filled 2017-01-14: qty 1

## 2017-01-14 MED ORDER — PHENYLEPHRINE 200 MCG/ML FOR PRIAPISM / HYPOTENSION
200.0000 ug | Freq: Once | INTRAMUSCULAR | Status: AC
  Administered 2017-01-14: 200 ug via INTRACAVERNOUS
  Filled 2017-01-14: qty 50

## 2017-01-14 MED ORDER — PHENYLEPHRINE 200 MCG/ML FOR PRIAPISM / HYPOTENSION
200.0000 ug | Freq: Once | INTRAMUSCULAR | Status: DC
  Filled 2017-01-14: qty 50

## 2017-01-14 NOTE — ED Notes (Addendum)
MD at bedside. Procedure explained to pt, consent signed.

## 2017-01-14 NOTE — ED Triage Notes (Signed)
Patient reports having an erections for the past 7 hours.  Patient reports that he was just seen on Wednesday for the same thing.

## 2017-01-14 NOTE — ED Notes (Signed)
Upon entering room to discharge pt the room was empty, no personal belongings in room, room empty, pt left without discharge, ED Provider Dr. Don Perking aware

## 2017-01-14 NOTE — ED Provider Notes (Signed)
[facility] Emergency Department Provider Note  ____________________________________________  Time seen: Approximately 7:03 AM  I have reviewed the triage vital signs and the nursing notes.   HISTORY  Chief Complaint Penis Pain   HPI TONIA CLOWE is a 41 y.o. 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 Patient seen here 2 days ago with resolution after phenylephrine injection.Patient reports 7 hours of erection. Moderate constant non radiating pain. Patient tried Sudafed at home with no relief.   Past Medical History:  Diagnosis Date  . ETOH abuse   . Hypertension   . Priapism     Patient Active Problem List   Diagnosis Date Noted  . Priapism, drug-induced   . Priapism     No past surgical history on file.  Prior to Admission medications   Medication Sig Start Date End Date Taking? Authorizing Provider  aspirin 325 MG tablet Take 325 mg by mouth daily as needed for mild pain.   Yes [provider]  hydrochlorothiazide (HYDRODIURIL) 25 MG tablet Take 1 tablet (25 mg total) by mouth daily. 02/21/16  Yes Schaevitz, Myra Rude, MD  lisinopril (PRINIVIL,ZESTRIL) 10 MG tablet Take 1 tablet (10 mg total) by mouth daily. 02/21/16 02/20/17 Yes Schaevitz, Myra Rude, MD    Allergies Patient has no known allergies.  No family history on file.  Social History Social History  Substance Use Topics  . Smoking status: Current Every Day Smoker    Packs/day: 1.00    Types: Cigarettes  . Smokeless tobacco: Never Used  . Alcohol use Yes     Comment: drinks 2 40 ounces a day    Review of Systems  Constitutional: Negative for fever. Eyes: Negative for visual changes. ENT: Negative for sore throat. Neck: No neck pain  Cardiovascular: Negative for chest pain. Respiratory: Negative for shortness of breath. Gastrointestinal: Negative for abdominal pain, vomiting or diarrhea. Genitourinary: Negative  for dysuria. + priapism Musculoskeletal: Negative for back pain. Skin: Negative for rash. Neurological: Negative for headaches, weakness or numbness. Psych: No SI or HI  ____________________________________________   PHYSICAL EXAM:  VITAL SIGNS: ED Triage Vitals  Enc Vitals Group     BP 01/14/17 0218 (!) 145/87     Pulse Rate 01/14/17 0218 (!) 114     Resp 01/14/17 0218 20     Temp 01/14/17 0218 98.3 F (36.8 C)     Temp Source 01/14/17 0218 Oral     SpO2 01/14/17 0218 95 %     Weight --      Height --      Head Circumference --      Peak Flow --      Pain Score 01/14/17 0217 10     Pain Loc --      Pain Edu? --      Excl. in GC? --     Constitutional: Alert and oriented. Well appearing and in no apparent distress. HEENT:      Head: Normocephalic and atraumatic.         Eyes: Conjunctivae are normal. Sclera is non-icteric.       Mouth/Throat: Mucous membranes are moist.       Neck: Supple with no signs of meningismus. Cardiovascular: Regular rate and rhythm. No murmurs, gallops, or rubs. 2+ symmetrical distal pulses are present in all extremities. No JVD. Respiratory: Normal respiratory effort. Lungs are clear to auscultation bilaterally. No wheezes, crackles, or rhonchi.  Gastrointestinal: Soft, non tender, and  non distended with positive bowel sounds. No rebound or guarding. Genitourinary: Priapism Musculoskeletal: Nontender with normal range of motion in all extremities. No edema, cyanosis, or erythema of extremities. Neurologic: Normal speech and language. Face is symmetric. Moving all extremities. No gross focal neurologic deficits are appreciated. Skin: Skin is warm, dry and intact. No rash noted. Psychiatric: Mood and affect are normal. Speech and behavior are normal.  ____________________________________________   LABS (all labs ordered are listed, but only abnormal results are displayed)  Labs Reviewed - No data to  display ____________________________________________  EKG  none  ____________________________________________  RADIOLOGY  none  ____________________________________________   PROCEDURES  Procedure(s) performed:yes Procedures   PRIAPISM TREATMENT (injection) Patient was prepped and draped in standard sterile fashion Just prior to procedure a timeout was performed Left corpus cavernosum was entered with a 25-gauge syringe 60 mcg injected  Right corpus cavernosum was entered with a 25-gauge syringe 60 mcg of phenylephrine injected  Some improvement however not fully resolved erection. Left corpus cavernosum was entered with a 25-gauge syringe for repeat dose of 60 mcg  Followed by right corpus cavernosum was entered with a 25-gauge syringe for dose of 200 mcg   The patient tolerated the procedure No complications  Critical Care performed:  None ____________________________________________   INITIAL IMPRESSION / ASSESSMENT AND PLAN / ED COURSE   41 y.o. 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 Patient received 60 mcg of intra cavernosum phenylephrine injection with some improvement but no full resolution of priapism. Discussed with Winship, urology on call who recommended giving 200 mcg. After that patient had significant improvement of his symptoms and left before dc papers were printed and given to him.     Pertinent labs & imaging results that were available during my care of the patient were reviewed by me and considered in my medical decision making (see chart for details).    ____________________________________________   FINAL CLINICAL IMPRESSION(S) / ED DIAGNOSES  Final diagnoses:  Priapism      NEW MEDICATIONS STARTED DURING THIS VISIT:  Discharge Medication List as of 01/14/2017  7:03 AM       Note:  This document was prepared using Dragon voice recognition software and may include  unintentional dictation errors.    Nita Sickle, MD 01/14/17 [id number]

## 2017-01-16 ENCOUNTER — Encounter: Payer: Self-pay | Admitting: Emergency Medicine

## 2017-01-16 ENCOUNTER — Emergency Department
Admission: EM | Admit: 2017-01-16 | Discharge: 2017-01-16 | Disposition: A | Discharge status: Home / Self Care | Payer: Self-pay | Attending: Emergency Medicine | Admitting: Emergency Medicine

## 2017-01-16 DIAGNOSIS — I10 Essential (primary) hypertension: Secondary | ICD-10-CM | POA: Insufficient documentation

## 2017-01-16 DIAGNOSIS — Z7982 Long term (current) use of aspirin: Secondary | ICD-10-CM | POA: Insufficient documentation

## 2017-01-16 DIAGNOSIS — Z79899 Other long term (current) drug therapy: Secondary | ICD-10-CM | POA: Insufficient documentation

## 2017-01-16 DIAGNOSIS — F17210 Nicotine dependence, cigarettes, uncomplicated: Secondary | ICD-10-CM | POA: Insufficient documentation

## 2017-01-16 DIAGNOSIS — N4830 Priapism, unspecified: Secondary | ICD-10-CM | POA: Insufficient documentation

## 2017-01-16 MED ORDER — PHENYLEPHRINE 200 MCG/ML FOR PRIAPISM / HYPOTENSION
200.0000 ug | Freq: Once | INTRAMUSCULAR | Status: DC
  Filled 2017-01-16: qty 50

## 2017-01-16 MED ORDER — ONDANSETRON HCL 4 MG/2ML IJ SOLN
4.0000 mg | Freq: Once | INTRAMUSCULAR | Status: AC
  Administered 2017-01-16: 4 mg via INTRAVENOUS
  Filled 2017-01-16: qty 2

## 2017-01-16 MED ORDER — MORPHINE SULFATE (PF) 4 MG/ML IV SOLN
4.0000 mg | Freq: Once | INTRAVENOUS | Status: AC
  Administered 2017-01-16: 4 mg via INTRAVENOUS
  Filled 2017-01-16: qty 1

## 2017-01-16 NOTE — ED Triage Notes (Signed)
Patient ambulatory to triage with steady gait, without difficulty or distress noted; pt seen here Sunday for priapism which never resolved completely after d/c

## 2017-01-16 NOTE — ED Notes (Signed)
Initiated conversation with pt regarding HTN. Pt states he has hypertension, however is not complaint with treatment. Pt states "that's for the doctor to worry about not me."

## 2017-01-16 NOTE — ED Notes (Signed)
Report to kristin, rn.  

## 2017-01-16 NOTE — ED Provider Notes (Signed)
[facility] Emergency Department Provider Note  ____________________________________________   First MD Initiated Contact with Patient 01/16/17 [phone]     (approximate)  I have reviewed the triage vital signs and the nursing notes.   HISTORY  Chief Complaint Penis Pain   HPI Bill Hammond is a 41 y.o. male with a history of recurrent priapism as well as hypertension and alcohol abuse is present to emergency department with priapism. He has been emergency department 3 times over the past month with priapism. He has had injections performed 2 out of the 3 times daily with detumescence.  He says that his priapism never fully resolved after his visit on August 22. He says that he took a dose of Sudafed at home prior to arrival today without relief. He is describing moderate to severe pain to the penis at this time. Says he usually gets his priapism when he waits too long to urinate. Denies cocaine use.  No history of sickle cell disease. Does not use trazodone.   Past Medical History:  Diagnosis Date  . ETOH abuse   . Hypertension   . Priapism     Patient Active Problem List   Diagnosis Date Noted  . Priapism, drug-induced   . Priapism     History reviewed. No pertinent surgical history.  Prior to Admission medications   Medication Sig Start Date End Date Taking? Authorizing Provider  aspirin 325 MG tablet Take 325 mg by mouth daily as needed for mild pain.    [provider]  hydrochlorothiazide (HYDRODIURIL) 25 MG tablet Take 1 tablet (25 mg total) by mouth daily. 02/21/16   Eithel Ryall, Myra Rude, MD  lisinopril (PRINIVIL,ZESTRIL) 10 MG tablet Take 1 tablet (10 mg total) by mouth daily. 02/21/16 02/20/17  Siri Buege, Myra Rude, MD    Allergies Patient has no known allergies.  No family history on file.  Social History Social History  Substance Use Topics  . Smoking status: Current Every Day Smoker    Packs/day: 1.00    Types:  Cigarettes  . Smokeless tobacco: Never Used  . Alcohol use Yes     Comment: drinks 2 40 ounces a day    Review of Systems  Constitutional: No fever/chills Eyes: No visual changes. ENT: No sore throat. Cardiovascular: Denies chest pain. Respiratory: Denies shortness of breath. Gastrointestinal: No abdominal pain.  No nausea, no vomiting.  No diarrhea.  No constipation. Genitourinary: Negative for dysuria. Musculoskeletal: Negative for back pain. Skin: Negative for rash. Neurological: Negative for headaches, focal weakness or numbness.   ____________________________________________   PHYSICAL EXAM:  VITAL SIGNS: ED Triage Vitals  Enc Vitals Group     BP 01/16/17 0421 (!) 157/106     Pulse Rate 01/16/17 0421 98     Resp 01/16/17 0421 20     Temp 01/16/17 0421 97.9 F (36.6 C)     Temp Source 01/16/17 0421 Oral     SpO2 01/16/17 0421 100 %     Weight 01/16/17 0419 263 lb (119.3 kg)     Height 01/16/17 0419 6' (1.829 m)     Head Circumference --      Peak Flow --      Pain Score 01/16/17 0420 10     Pain Loc --      Pain Edu? --      Excl. in GC? --     Constitutional: Alert and oriented. Well appearing and in no acute distress. Eyes: Conjunctivae are normal.  Head: Atraumatic.  Nose: No congestion/rhinnorhea. Mouth/Throat: Mucous membranes are moist.  Neck: No stridor.   Cardiovascular: Normal rate, regular rhythm. Grossly normal heart sounds.  Respiratory: Normal respiratory effort.  No retractions. Lungs CTAB. Gastrointestinal: Soft and nontender. No distention. No CVA tenderness. Genitourinary: Bilateral corpora cavernosa with moderate engorgement. The penis is not upright but the corpora do feel engorged. The penis is mildly tender to palpation. There is no tenderness or swelling of the testicles bilaterally. Musculoskeletal: No lower extremity tenderness nor edema.  No joint effusions. Neurologic:  Normal speech and language. No gross focal neurologic deficits  are appreciated. Skin:  Skin is warm, dry and intact. No rash noted. Psychiatric: Mood and affect are normal. Speech and behavior are normal.  ____________________________________________   LABS (all labs ordered are listed, but only abnormal results are displayed)  Labs Reviewed - No data to display ____________________________________________  EKG   ____________________________________________  RADIOLOGY   ____________________________________________   PROCEDURES  Procedure(s) performed:   PRIAPISM TREATMENT (injection) Patient was prepped and draped in standard sterile fashion Just prior to procedure a timeout was performed Left corpus cavernosum was entered with a 23-gauge syringe Blood was aspirated 1 cc of 200 mcg/ml phenylephrine was injected The patient tolerated the procedure No complications   Procedures  Critical Care performed:   ____________________________________________   INITIAL IMPRESSION / ASSESSMENT AND PLAN / ED COURSE  Pertinent labs & imaging results that were available during my care of the patient were reviewed by me and considered in my medical decision making (see chart for details).  ----------------------------------------- 7:01 AM on 01/16/2017 ----------------------------------------- I discussed the case with Dr. Mena Goes of urology and he recommends 200 g per mL injection of 1 mL into the corpora. Patient is now pain-free and has Detumesced after injection.  Patient will be discharged home. Will be given urology follow-up. However, the patient says that he has been unable to follow up with urology in the office due to insurance issues.      ____________________________________________   FINAL CLINICAL IMPRESSION(S) / ED DIAGNOSES  Priapism.    NEW MEDICATIONS STARTED DURING THIS VISIT:  New Prescriptions   No medications on file     Note:  This document was prepared using Dragon voice recognition software and  may include unintentional dictation errors.     Myrna Blazer, MD 01/16/17 [phone]

## 2017-01-16 NOTE — ED Notes (Signed)
md on phone speaking with urologist.

## 2017-01-16 NOTE — ED Notes (Signed)
Pillow, blankets provided to pt and visitor. PO fluids provided to visitor per request. Pt resting in no acute distress.

## 2017-09-26 ENCOUNTER — Encounter: Payer: Self-pay | Admitting: Emergency Medicine

## 2017-09-26 ENCOUNTER — Other Ambulatory Visit: Payer: Self-pay

## 2017-09-26 ENCOUNTER — Emergency Department
Admission: EM | Admit: 2017-09-26 | Discharge: 2017-09-26 | Disposition: A | Discharge status: Home / Self Care | Payer: Self-pay | Attending: Emergency Medicine | Admitting: Emergency Medicine

## 2017-09-26 DIAGNOSIS — F17210 Nicotine dependence, cigarettes, uncomplicated: Secondary | ICD-10-CM | POA: Insufficient documentation

## 2017-09-26 DIAGNOSIS — M7712 Lateral epicondylitis, left elbow: Secondary | ICD-10-CM | POA: Insufficient documentation

## 2017-09-26 DIAGNOSIS — Z79899 Other long term (current) drug therapy: Secondary | ICD-10-CM | POA: Insufficient documentation

## 2017-09-26 DIAGNOSIS — I10 Essential (primary) hypertension: Secondary | ICD-10-CM | POA: Insufficient documentation

## 2017-09-26 MED ORDER — METHYLPREDNISOLONE 4 MG PO TABS: 4.0000 mg | ORAL_TABLET | Freq: Every day | ORAL | 0 refills | Status: DC

## 2017-09-26 NOTE — ED Provider Notes (Signed)
[facility] EMERGENCY DEPARTMENT Provider Note   CSN: [id number] Arrival date & time: 09/26/17  1859     History   Chief Complaint No chief complaint on file.   HPI Bill Hammond is a 42 y.o. male.  Presents to the emergency department for evaluation of left elbow pain.  He said pain for 3 weeks.  Pain is moderate.  He points the lateral epicondyle.  No trauma or injury.  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  Patient denies any neck pain burning numbness or tingling.  No weakness.  He took [facility] powder x1 with no improvement.  He has a history of hypertension and he is not diabetic. HPI  Past Medical History:  Diagnosis Date  . ETOH abuse   . Hypertension   . Priapism     Patient Active Problem List   Diagnosis Date Noted  . Priapism, drug-induced   . Priapism     History reviewed. No pertinent surgical history.      Home Medications    Prior to Admission medications   Medication Sig Start Date End Date Taking? Authorizing Provider  aspirin 325 MG tablet Take 325 mg by mouth daily as needed for mild pain.    [provider]  hydrochlorothiazide (HYDRODIURIL) 25 MG tablet Take 1 tablet (25 mg total) by mouth daily. 02/21/16   Schaevitz, Myra Rude, MD  lisinopril (PRINIVIL,ZESTRIL) 10 MG tablet Take 1 tablet (10 mg total) by mouth daily. 02/21/16 02/20/17  Myrna Blazer, MD  methylPREDNISolone (MEDROL) 4 MG tablet Take 1 tablet (4 mg total) by mouth daily. Take as directed 09/26/17   Evon Slack, PA-C    Family History No family history on file.  Social History Social History   Tobacco Use  . Smoking status: Current Every Day Smoker    Packs/day: 1.00    Types: Cigarettes  . Smokeless tobacco: Never Used  Substance Use Topics  . Alcohol use: Yes    Comment: drinks 2 40 ounces a day  . 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   Review of Systems Review of Systems  Constitutional: Negative for fever.  Respiratory: Negative for shortness of breath.   Cardiovascular: Negative for chest pain.  Gastrointestinal: Negative for abdominal pain.  Genitourinary: Negative for difficulty urinating, dysuria and urgency.  Musculoskeletal: Positive for arthralgias. Negative for back pain and myalgias.  Skin: Negative for rash.  Neurological: Negative for dizziness, weakness, numbness and headaches.     Physical Exam Updated Vital Signs BP (!) 183/1 (BP Location: Left Arm)   Pulse 98   Temp 98.2 F (36.8 C)   Resp 18   Ht 6' (1.829 m)   Wt 122 kg (269 lb)   SpO2 99%   BMI 36.48 kg/m   Physical Exam  Constitutional: He is oriented to person, place, and time. He appears well-developed and well-nourished.  HENT:  Head: Normocephalic and atraumatic.  Eyes: Conjunctivae are normal.  Neck: Normal range of motion.  Cardiovascular: Normal rate.  Pulmonary/Chest: Effort normal. No respiratory distress.  Musculoskeletal: Normal range of motion.  Left elbow shows full range of motion.  No swelling warmth erythema or effusion.  Nontender along the olecranon or medial aspect elbow.  Patient is tender along the lateral epicondyles and is painful resisted wrist extension.  Full active range of  motion of the shoulder wrist and digits.  Patient is neurovascular intact in left upper extremity.  Neurological: He is alert and oriented to person, place, and time.  Skin: Skin is warm. No rash noted.  Psychiatric: He has a normal mood and affect. His behavior is normal. Thought content normal.     ED Treatments / Results  Labs (all labs ordered are listed, but only abnormal results are displayed) Labs Reviewed - No data to display  EKG None  Radiology No results found.  Procedures Procedures (including critical care time)  Medications Ordered in ED Medications - No data to display   Initial  Impression / Assessment and Plan / ED Course  I have reviewed the triage vital signs and the nursing notes.  Pertinent labs & imaging results that were available during my care of the patient were reviewed by me and considered in my medical decision making (see chart for details).     42 year old male with lateral epicondylitis to the left elbow.  He will wear tennis elbow strap.  He is placed on a 6-day Medrol Dosepak.  Patient will avoid lifting with the palm down and limp with the palm up.  Patient will follow-up with orthopedics if no improvement in 1 week.  Final Clinical Impressions(s) / ED Diagnoses   Final diagnoses:  Lateral epicondylitis, left elbow    ED Discharge Orders        Ordered    methylPREDNISolone (MEDROL) 4 MG tablet  Daily     09/26/17 1959       Ronnette Juniper 09/26/17 2001    Jeanmarie Plant, MD 09/26/17 2221

## 2017-09-26 NOTE — Discharge Instructions (Addendum)
Please wear tennis elbow strap at all times except for showering.  Avoid lifting with the palm down, lift with the palm up.  Take prednisone taper as prescribed.  Follow-up with orthopedics if no improvement in 1 week.

## 2017-09-26 NOTE — ED Triage Notes (Addendum)
Patient ambulatory to triage with steady gait, without difficulty or distress noted; pt reports x 3wks having pain to left elbow, worse when lifting arm or making fist

## 2018-04-28 ENCOUNTER — Encounter: Payer: Self-pay | Admitting: Emergency Medicine

## 2018-04-28 ENCOUNTER — Other Ambulatory Visit: Payer: Self-pay

## 2018-04-28 ENCOUNTER — Emergency Department
Admission: EM | Admit: 2018-04-28 | Discharge: 2018-04-29 | Disposition: A | Discharge status: Home / Self Care | Payer: Self-pay | Attending: Emergency Medicine | Admitting: Emergency Medicine

## 2018-04-28 DIAGNOSIS — Z79899 Other long term (current) drug therapy: Secondary | ICD-10-CM | POA: Insufficient documentation

## 2018-04-28 DIAGNOSIS — I10 Essential (primary) hypertension: Secondary | ICD-10-CM | POA: Insufficient documentation

## 2018-04-28 DIAGNOSIS — R51 Headache: Secondary | ICD-10-CM | POA: Insufficient documentation

## 2018-04-28 DIAGNOSIS — F17210 Nicotine dependence, cigarettes, uncomplicated: Secondary | ICD-10-CM | POA: Insufficient documentation

## 2018-04-28 DIAGNOSIS — R519 Headache, unspecified: Secondary | ICD-10-CM

## 2018-04-28 LAB — COMPREHENSIVE METABOLIC PANEL WITH GFR
ALT: 18 U/L (ref 0–44)
AST: 26 U/L (ref 15–41)
Albumin: 4.1 g/dL (ref 3.5–5.0)
Alkaline Phosphatase: 82 U/L (ref 38–126)
Anion gap: 9 (ref 5–15)
BUN: 11 mg/dL (ref 6–20)
CO2: 22 mmol/L (ref 22–32)
Calcium: 8.8 mg/dL — ABNORMAL LOW (ref 8.9–10.3)
Chloride: 106 mmol/L (ref 98–111)
Creatinine, Ser: 0.88 mg/dL (ref 0.61–1.24)
GFR calc Af Amer: >60 mL/min
GFR calc non Af Amer: >60 mL/min
Glucose, Bld: 96 mg/dL (ref 70–99)
Potassium: 4.3 mmol/L (ref 3.5–5.1)
Sodium: 137 mmol/L (ref 135–145)
Total Bilirubin: 0.4 mg/dL (ref 0.3–1.2)
Total Protein: 7.9 g/dL (ref 6.5–8.1)

## 2018-04-28 LAB — CBC WITH DIFFERENTIAL/PLATELET
Abs Immature Granulocytes: 0.05 10*3/uL (ref 0.00–0.07)
Basophils Absolute: 0.1 10*3/uL (ref 0.0–0.1)
Basophils Relative: 1 %
Eosinophils Absolute: 0.2 10*3/uL (ref 0.0–0.5)
Eosinophils Relative: 2 %
HCT: 39.2 % (ref 39.0–52.0)
Hemoglobin: 12.6 g/dL — ABNORMAL LOW (ref 13.0–17.0)
Immature Granulocytes: 1 %
Lymphocytes Relative: 25 %
Lymphs Abs: 1.9 10*3/uL (ref 0.7–4.0)
MCH: 30 pg (ref 26.0–34.0)
MCHC: 32.1 g/dL (ref 30.0–36.0)
MCV: 93.3 fL (ref 80.0–100.0)
Monocytes Absolute: 0.5 10*3/uL (ref 0.1–1.0)
Monocytes Relative: 7 %
Neutro Abs: 5 10*3/uL (ref 1.7–7.7)
Neutrophils Relative %: 64 %
Platelets: 338 10*3/uL (ref 150–400)
RBC: 4.2 MIL/uL — ABNORMAL LOW (ref 4.22–5.81)
RDW: 14.5 % (ref 11.5–15.5)
WBC: 7.7 10*3/uL (ref 4.0–10.5)
nRBC: 0 % (ref 0.0–0.2)

## 2018-04-28 LAB — TROPONIN I: Troponin I: <0.03 ng/mL (ref <0.03)

## 2018-04-28 MED ORDER — CLONIDINE HCL 0.1 MG PO TABS
0.2000 mg | ORAL_TABLET | Freq: Once | ORAL | Status: AC
Start: 2018-04-28 — End: 2018-04-28
  Administered 2018-04-28: 0.2 mg via ORAL
  Filled 2018-04-28: qty 2

## 2018-04-28 NOTE — ED Provider Notes (Signed)
[facility] Emergency Department Provider Note ____________________________________________  Time seen: Approximately 10:43 PM  I have reviewed the triage vital signs and the nursing notes.   HISTORY  Chief Complaint Headache   HPI Bill Hammond is a 42 y.o. male who presents to the emergency department for treatment and evaluation of headache. Symptoms started about 2 weeks ago.  Patient states that he has not been taking his antihypertension medications.  He states that when his blood pressure opioids gets a headache like this.  He denies any chest pain, shortness of breath, blurry vision, or other symptoms of concern.  Location: Frontal Similar to previous headaches: Yes Duration: Today TIMING: Upon awakening SEVERITY: 8/10 QUALITY: Pounding CONTEXT: Not taking antihypertensives MODIFYING FACTORS: None ASSOCIATED SYMPTOMS: None Past Medical History:  Diagnosis Date  . ETOH abuse   . Hypertension   . Priapism     Patient Active Problem List   Diagnosis Date Noted  . Priapism, drug-induced   . Priapism     History reviewed. No pertinent surgical history.  Prior to Admission medications   Medication Sig Start Date End Date Taking? Authorizing Provider  aspirin 325 MG tablet Take 325 mg by mouth daily as needed for mild pain.    [provider]  hydrochlorothiazide (HYDRODIURIL) 25 MG tablet Take 1 tablet (25 mg total) by mouth daily. 02/21/16   Schaevitz, Myra Rudeavid Matthew, MD  lisinopril (PRINIVIL,ZESTRIL) 10 MG tablet Take 1 tablet (10 mg total) by mouth daily. 02/21/16 02/20/17  Myrna BlazerSchaevitz, David Matthew, MD  lisinopril-hydrochlorothiazide (PRINZIDE,ZESTORETIC) 10-12.5 MG tablet Take 1 tablet by mouth daily. 04/29/18   Jarnell Cordaro B, FNP  methylPREDNISolone (MEDROL) 4 MG tablet Take 1 tablet (4 mg total) by mouth daily. Take as directed 09/26/17   Evon SlackGaines, Thomas C, PA-C    Allergies Patient has no known allergies.  History reviewed. No  pertinent family history.  Social History Social History   Tobacco Use  . Smoking status: Current Every Day Smoker    Packs/day: 1.00    Types: Cigarettes  . Smokeless tobacco: Never Used  Substance Use Topics  . Alcohol use: Yes    Comment: drinks 2 40 ounces a day  . 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 Eyes: No visual changes. ENT: No sore throat. Respiratory: Denies shortness of breath. Gastrointestinal: No abdominal pain.  No nausea, no vomiting.  No diarrhea.  No constipation. Musculoskeletal: Negative for pain. Skin: Negative for rash. Neurological:Positive for headache, negative for focal weakness or numbness. No confusion or fainting. ___________________________________________   PHYSICAL EXAM:  VITAL SIGNS: ED Triage Vitals  Enc Vitals Group     BP 04/28/18 2230 (!) 182/120     Pulse Rate 04/28/18 2230 (!) 102     Resp 04/28/18 2230 18     Temp 04/28/18 2230 98.1 F (36.7 C)     Temp Source 04/28/18 2230 Oral     SpO2 04/28/18 2230 99 %     Weight 04/28/18 2229 279 lb (126.6 kg)     Height 04/28/18 2229 6' (1.829 m)     Head Circumference --      Peak Flow --      Pain Score 04/28/18 2228 8     Pain Loc --      Pain Edu? --      Excl. in GC? --  Constitutional: Alert and oriented. Well appearing and in no acute distress. Eyes: Conjunctivae are normal. PERRL. EOMI without expressed pain. No evidence of papilledema on limited exam. Head: Atraumatic. Nose: No congestion/rhinnorhea. Mouth/Throat: Mucous membranes are moist.  Oropharynx non-erythematous. Neck: No stridor. Supple, no meningismus.  Cardiovascular: Normal rate, regular rhythm. Grossly normal heart sounds.  Good peripheral circulation. Respiratory: Normal respiratory effort.  No retractions. Lungs CTAB. Gastrointestinal: Soft and nontender. No distention.  Musculoskeletal: No lower extremity  tenderness nor edema.  No joint effusions. Neurologic:  Normal speech and language. No gross focal neurologic deficits are appreciated. No gait instability. Cranial nerves: 2-10 normal as tested. Cerebellar:Normal Romberg, finger-nose-finger, heel to shin, normal gait. Sensorimotor: No aphasia, pronator drift, clonus, sensory loss or abnormal reflexes.  Skin:  Skin is warm, dry and intact. No rash noted. Psychiatric: Mood and affect are normal. Speech and behavior are normal. Normal thought process and cognition.  ____________________________________________   LABS (all labs ordered are listed, but only abnormal results are displayed)  Labs Reviewed  COMPREHENSIVE METABOLIC PANEL - Abnormal; Notable for the following components:      Result Value   Calcium 8.8 (*)    All other components within normal limits  CBC WITH DIFFERENTIAL/PLATELET - Abnormal; Notable for the following components:   RBC 4.20 (*)    Hemoglobin 12.6 (*)    All other components within normal limits  TROPONIN I   ____________________________________________  EKG  ED ECG REPORT I, Kem Boroughs, the attending physician, personally viewed and interpreted this ECG.   Date: 04/29/2018  EKG Time: 12:22 AM  Rate: 91  Rhythm: normal EKG, normal sinus rhythm  Axis: No axis deviation  Intervals:none  ST&T Change: No ST elevation  ____________________________________________  RADIOLOGY  No results found. ____________________________________________   PROCEDURES  Procedure(s) performed:  Procedures  Critical Care performed: None ____________________________________________   INITIAL IMPRESSION / ASSESSMENT AND PLAN / ED COURSE  42 year old male presenting to the emergency department for treatment and evaluation of headache that is likely secondary to hypertension.  Initial management will consist of clonidine 0.2 mg and close monitoring.  ----------------------------------------- 12:10 AM on  04/29/2018 ----------------------------------------- Pressure has decreased somewhat.  153/119.  Patient states that this is what he typically runs.  His headache has improved.   ----------------------------------------- 1:36 AM on 04/29/2018 -----------------------------------------  Patient to be discharged home.  He will be given a prescription for his lisinopril hydrochlorothiazide.  He verbalized intent to schedule a follow-up appointment with primary care and was provided information for [employer].  He is to call tomorrow to see if he can schedule an appointment.  Patient was encouraged to return to the emergency department for any concerning symptoms if unable to see primary care right away.    Pertinent labs & imaging results that were available during my care of the patient were reviewed by me and considered in my medical decision making (see chart for details). ____________________________________________   FINAL CLINICAL IMPRESSION(S) / ED DIAGNOSES  Final diagnoses:  Acute intractable headache, unspecified headache type  Hypertension, unspecified type    ED Discharge Orders         Ordered    lisinopril-hydrochlorothiazide (PRINZIDE,ZESTORETIC) 10-12.5 MG tablet  Daily     04/29/18 0033            Chinita Pester, FNP 04/29/18 0242    Arnaldo Natal, MD 04/29/18 [phone]

## 2018-04-28 NOTE — ED Triage Notes (Signed)
Pt here for headaches on and off last couple weeks. This headache started last night.  Diaphoresis noted to forehead.  + photophobia and vomiting.  Pt is a daily drinker. Has not had a drink since last night.  No hx headaches.

## 2018-04-29 MED ORDER — ACETAMINOPHEN 325 MG PO TABS
650.0000 mg | ORAL_TABLET | Freq: Once | ORAL | Status: AC
Start: 2018-04-29 — End: 2018-04-29
  Administered 2018-04-29: 650 mg via ORAL
  Filled 2018-04-29: qty 2

## 2018-04-29 MED ORDER — IBUPROFEN 600 MG PO TABS
600.0000 mg | ORAL_TABLET | Freq: Once | ORAL | Status: AC
  Administered 2018-04-29: 600 mg via ORAL
  Filled 2018-04-29: qty 1

## 2018-04-29 MED ORDER — LISINOPRIL-HYDROCHLOROTHIAZIDE 10-12.5 MG PO TABS: 1.0000 | ORAL_TABLET | Freq: Every day | ORAL | 1 refills | Status: DC

## 2018-04-29 MED ORDER — LISINOPRIL 10 MG PO TABS
10.0000 mg | ORAL_TABLET | Freq: Once | ORAL | Status: AC
  Administered 2018-04-29: 10 mg via ORAL
  Filled 2018-04-29: qty 1

## 2018-04-29 NOTE — Discharge Instructions (Signed)
Please call and schedule an appointment with primary care.   Return to the ER for symptoms that change or worsen if unable to schedule an appointment. 

## 2018-04-29 NOTE — ED Provider Notes (Signed)
-----------------------------------------   12:35 AM on 04/29/2018 -----------------------------------------  I personally saw the patient with NP Triplett.  In summary, this is a 42 year old male with hypertension who has been out of his medicines for the past several months.  Comes in with a gradual onset headache x2 weeks.  Tells me usually if his nose is bleeding or he has a headache he knows that his blood pressure is high.  Over the course of the evaluation, his systolic blood pressure has improved after oral clonidine.  Headache is resolved and patient states he is currently at his baseline blood pressure of 150s over 110s.  His neck is supple and patient has no focal neurological deficits on my examination.  He will be discharged home with refills of his antihypertensives and follow-up closely with his PCP.  Strict return precautions given.  Patient verbalizes understanding agrees with plan of care.   Irean HongSung, Jade J, MD 04/29/18 [phone]

## 2018-05-17 ENCOUNTER — Emergency Department
Admission: EM | Admit: 2018-05-17 | Discharge: 2018-05-17 | Disposition: A | Discharge status: Home / Self Care | Payer: Self-pay | Attending: Emergency Medicine | Admitting: Emergency Medicine

## 2018-05-17 ENCOUNTER — Other Ambulatory Visit: Payer: Self-pay

## 2018-05-17 DIAGNOSIS — F17210 Nicotine dependence, cigarettes, uncomplicated: Secondary | ICD-10-CM | POA: Insufficient documentation

## 2018-05-17 DIAGNOSIS — I10 Essential (primary) hypertension: Secondary | ICD-10-CM | POA: Insufficient documentation

## 2018-05-17 DIAGNOSIS — N4830 Priapism, unspecified: Secondary | ICD-10-CM | POA: Insufficient documentation

## 2018-05-17 DIAGNOSIS — Z79899 Other long term (current) drug therapy: Secondary | ICD-10-CM | POA: Insufficient documentation

## 2018-05-17 MED ORDER — PHENYLEPHRINE 200 MCG/ML FOR PRIAPISM / HYPOTENSION
200.0000 ug | INTRAMUSCULAR | Status: DC | PRN
  Administered 2018-05-17: 200 ug via INTRACAVERNOUS
  Filled 2018-05-17: qty 50

## 2018-05-17 MED ORDER — LIDOCAINE-PRILOCAINE 2.5-2.5 % EX CREA
TOPICAL_CREAM | Freq: Once | CUTANEOUS | Status: AC
  Administered 2018-05-17: 1 via TOPICAL
  Filled 2018-05-17: qty 5

## 2018-05-17 MED ORDER — SODIUM CHLORIDE 0.9 % IV BOLUS
1000.0000 mL | Freq: Once | INTRAVENOUS | Status: AC
  Administered 2018-05-17: 1000 mL via INTRAVENOUS

## 2018-05-17 MED ORDER — HYDROMORPHONE HCL 1 MG/ML IJ SOLN
1.0000 mg | Freq: Once | INTRAMUSCULAR | Status: AC
  Administered 2018-05-17: 1 mg via INTRAVENOUS
  Filled 2018-05-17: qty 1

## 2018-05-17 NOTE — ED Notes (Signed)
Pt stated that he woke up at 1100 and stated that he was in pain that was similar to what happened last time that he came to hospital with an erection. Pt AxOx4.

## 2018-05-17 NOTE — ED Triage Notes (Signed)
Erection x 4 hours. Has happened before. Did not take medication for erection. Appears uncomfortable. A&O.

## 2018-05-17 NOTE — ED Notes (Signed)
Pt BP elevated. MD notified and okay with pt going home to take normal BP medications. Pt okay with being discharged.

## 2018-05-17 NOTE — ED Provider Notes (Signed)
[facility] Emergency Department Provider Note ____________________________________________   First MD Initiated Contact with Patient 05/17/18 1508     (approximate)  I have reviewed the triage vital signs and the nursing notes.   HISTORY  Chief Complaint Personal Problem    HPI Bill Hammond is a 42 y.o. male with PMH as noted below who presents with priapism, acute onset 4 hours ago, associated with pain but no other acute symptoms.  Patient states he has had priapism several times before although no specific cause was identified.  He denies any cocaine use or any other illicit drugs except for marijuana.  He is not on trazodone or other medications known to cause priapism.  His last episode was last year.   Past Medical History:  Diagnosis Date  . ETOH abuse   . Hypertension   . Priapism     Patient Active Problem List   Diagnosis Date Noted  . Priapism, drug-induced   . Priapism     History reviewed. No pertinent surgical history.  Prior to Admission medications   Medication Sig Start Date End Date Taking? Authorizing Provider  aspirin 325 MG tablet Take 325 mg by mouth daily as needed for mild pain.    [provider]  hydrochlorothiazide (HYDRODIURIL) 25 MG tablet Take 1 tablet (25 mg total) by mouth daily. 02/21/16   Schaevitz, Myra Rudeavid Matthew, MD  lisinopril (PRINIVIL,ZESTRIL) 10 MG tablet Take 1 tablet (10 mg total) by mouth daily. 02/21/16 02/20/17  Myrna BlazerSchaevitz, David Matthew, MD  lisinopril-hydrochlorothiazide (PRINZIDE,ZESTORETIC) 10-12.5 MG tablet Take 1 tablet by mouth daily. 04/29/18   Triplett, Cari B, FNP  methylPREDNISolone (MEDROL) 4 MG tablet Take 1 tablet (4 mg total) by mouth daily. Take as directed 09/26/17   Evon SlackGaines, Thomas C, PA-C    Allergies Patient has no known allergies.  History reviewed. No pertinent family history.  Social History Social History   Tobacco Use  . Smoking status: Current Every Day Smoker   Packs/day: 1.00    Types: Cigarettes  . Smokeless tobacco: Never Used  Substance Use Topics  . Alcohol use: Yes    Comment: drinks 2 40 ounces a day  . 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 Eyes: No redness. ENT: No neck pain.  Cardiovascular: Denies chest pain. Respiratory: Denies shortness of breath. Gastrointestinal: No abdominal pain.  Genitourinary: Negative for dysuria.  Positive for priapism. Musculoskeletal: Negative for back pain. Skin: Negative for rash. Neurological: Negative for headache.   ____________________________________________   PHYSICAL EXAM:  VITAL SIGNS: ED Triage Vitals  Enc Vitals Group     BP 05/17/18 1421 (!) 198/118     Pulse Rate 05/17/18 1421 (!) 104     Resp 05/17/18 1421 12     Temp 05/17/18 1421 98.1 F (36.7 C)     Temp Source 05/17/18 1421 Oral     SpO2 05/17/18 1421 99 %     Weight --      Height --      Head Circumference --      Peak Flow --      Pain Score 05/17/18 1425 10     Pain Loc --      Pain Edu? --      Excl. in GC? --     Constitutional: Alert and oriented.  Slightly uncomfortable appearing but in no acute distress. Eyes: Conjunctivae are  normal.  Head: Atraumatic. Nose: No congestion/rhinnorhea. Mouth/Throat: Mucous membranes are moist.   Neck: Normal range of motion.  Cardiovascular: Good peripheral circulation. Respiratory: Normal respiratory effort.  Gastrointestinal: Soft and nontender. No distention.  Genitourinary: Erect penis with no other acute abnormalities.  Testes nontender. Musculoskeletal: Extremities warm and well perfused.  Neurologic:  Normal speech and language. No gross focal neurologic deficits are appreciated.  Skin:  Skin is warm and dry. No rash noted. Psychiatric: Mood and affect are normal. Speech and behavior are normal.  ____________________________________________   LABS (all labs ordered are listed, but  only abnormal results are displayed)  Labs Reviewed - No data to display ____________________________________________  EKG   ____________________________________________  RADIOLOGY    ____________________________________________   PROCEDURES  Procedure(s) performed: Yes   Intracavernosal phenylephrine injection, priapism  Date/Time: 05/17/2018 4:20 PM Performed by: Dionne BucySiadecki, Maxton Noreen, MD Authorized by: Dionne BucySiadecki, Stiven Kaspar, MD  Consent: Verbal consent obtained. Risks and benefits: risks, benefits and alternatives were discussed Consent given by: patient Patient identity confirmed: verbally with patient and arm band Time out: Immediately prior to procedure a "time out" was called to verify the correct patient, procedure, equipment, support staff and site/side marked as required. Preparation: Patient was prepped and draped in the usual sterile fashion. Local anesthesia used: yes  Anesthesia: Local anesthesia used: yes Local Anesthetic: topical anesthetic  Sedation: Patient sedated: no  Patient tolerance: Patient tolerated the procedure well with no immediate complications Comments: 200 mcg/mL phenylephrine was prepared.  25-gauge needle was inserted into the corpus cavernosum under sterile conditions.  A total of 6 mL of the phenylephrine solution was administered with 1 mL every 2 to 3 minutes until detumescence was achieved.     Critical Care performed: No ____________________________________________   INITIAL IMPRESSION / ASSESSMENT AND PLAN / ED COURSE  Pertinent labs & imaging results that were available during my care of the patient were reviewed by me and considered in my medical decision making (see chart for details).  42 year old male with PMH as noted below including prior history of priapism presents with priapism over approximately last 4 hours.  He denies any other acute symptoms.  On exam the patient is well-appearing and his vital signs are  normal except for hypertension and borderline tachycardia likely related to his acute discomfort.  I consulted Dr. Alvester MorinBell from urology.  He recommends starting with intracavernosal phenylephrine injection.  The patient declines penile nerve block or ring block, so we will use topical anesthetic per his request.  ----------------------------------------- 5:31 PM on 05/17/2018 -----------------------------------------  The patient was injected with phenylephrine as discussed with Dr. Alvester MorinBell.  Dr. Alvester MorinBell also came in towards the end of the procedure and saw the patient in person.  Aspiration and irrigation were not required.  The patient detumesced without complications.  He tolerated the procedure well.  The patient was observed for a further 45 minutes with no recurrence of the priapism.  He is stable for discharge home.  He was instructed to follow-up with the urologist.  Referral has been provided.  Return precautions given, and he expressed understanding. ____________________________________________   FINAL CLINICAL IMPRESSION(S) / ED DIAGNOSES  Final diagnoses:  Priapism      NEW MEDICATIONS STARTED DURING THIS VISIT:  New Prescriptions   No medications on file     Note:  This document was prepared using Dragon voice recognition software and may include unintentional dictation errors.   Dionne BucySiadecki, Chiquita Heckert, MD 05/17/18 [phone]

## 2018-05-17 NOTE — Discharge Instructions (Addendum)
Return to the ER for new, worsening, recurrent erection or other acute problems.  Follow-up with the urologist in the next few weeks.

## 2018-05-17 NOTE — Consult Note (Signed)
H&P Physician requesting consult: Dr Marisa SeverinSiadecki, MD  Chief Complaint: Priapism  History of Present Illness: 42 year old male with a history of priapism.  He denies a history of sickle cell.  He states he has been worked up for this.  He denies cocaine use.  He admits to alcohol and marijuana use.  He woke up at 11 AM with priapism.  He presented about 4 hours later.  He has had this before.  He required irrigation.  He denies use of PDE 5 inhibitors or penile injections.  Past Medical History:  Diagnosis Date  . ETOH abuse   . Hypertension   . Priapism    History reviewed. No pertinent surgical history.  Home Medications:  (Not in a hospital admission)  Allergies: No Known Allergies  History reviewed. No pertinent family history. Social History:  reports that he has been smoking cigarettes. He has been smoking about 1.00 pack per day. He has never used smokeless tobacco. He reports current alcohol use. He reports that he does not use drugs.  ROS: A complete review of systems was performed.  All systems are negative except for pertinent findings as noted. ROS   Physical Exam:  Vital signs in last 24 hours: Temp:  [98.1 F (36.7 C)] 98.1 F (36.7 C) (12/21 1421) Pulse Rate:  [93-104] 93 (12/21 1600) Resp:  [12-22] 22 (12/21 1600) BP: (180-198)/(110-123) 180/120 (12/21 1600) SpO2:  [93 %-99 %] 98 % (12/21 1600) General:  Alert and oriented, No acute distress HEENT: Normocephalic, atraumatic Neck: No JVD or lymphadenopathy Cardiovascular: Regular rate and rhythm Lungs: Regular rate and effort Abdomen: Soft, nontender, nondistended, no abdominal masses To urinary: Circumcised phallus with erection.  Erection was coming down with phenylephrine which Dr. Marisa SeverinSiadecki was administering Back: No CVA tenderness Extremities: No edema Neurologic: Grossly intact  Laboratory Data:  No results found for this or any previous visit (from the past 24 hour(s)). No results found for this or  any previous visit (from the past 240 hour(s)). Creatinine: No results for input(s): CREATININE in the last 168 hours.  Impression/Assessment:  Priapism  Plan:  Erection seems to be coming down nicely with the use of phenylephrine.  He should follow-up outpatient.  One potential option is to provide the patient with a prescription of self inject phenylephrine for acute episodes of priapism.  Ray ChurchEugene D Harrison Paulson, III 05/17/2018, 4:58 PM

## 2018-05-19 ENCOUNTER — Emergency Department
Admission: EM | Admit: 2018-05-19 | Discharge: 2018-05-19 | Disposition: A | Discharge status: Home / Self Care | Payer: Self-pay | Attending: Emergency Medicine | Admitting: Emergency Medicine

## 2018-05-19 ENCOUNTER — Encounter: Payer: Self-pay | Admitting: Emergency Medicine

## 2018-05-19 DIAGNOSIS — I10 Essential (primary) hypertension: Secondary | ICD-10-CM | POA: Insufficient documentation

## 2018-05-19 DIAGNOSIS — R339 Retention of urine, unspecified: Secondary | ICD-10-CM | POA: Insufficient documentation

## 2018-05-19 DIAGNOSIS — F17210 Nicotine dependence, cigarettes, uncomplicated: Secondary | ICD-10-CM | POA: Insufficient documentation

## 2018-05-19 DIAGNOSIS — Z79899 Other long term (current) drug therapy: Secondary | ICD-10-CM | POA: Insufficient documentation

## 2018-05-19 DIAGNOSIS — Z7982 Long term (current) use of aspirin: Secondary | ICD-10-CM | POA: Insufficient documentation

## 2018-05-19 DIAGNOSIS — N4839 Other priapism: Secondary | ICD-10-CM | POA: Insufficient documentation

## 2018-05-19 DIAGNOSIS — N4830 Priapism, unspecified: Secondary | ICD-10-CM

## 2018-05-19 LAB — CBC WITH DIFFERENTIAL/PLATELET
Abs Immature Granulocytes: 0.03 10*3/uL (ref 0.00–0.07)
Basophils Absolute: 0 10*3/uL (ref 0.0–0.1)
Basophils Relative: 0 %
EOS PCT: 3 %
Eosinophils Absolute: 0.2 10*3/uL (ref 0.0–0.5)
HCT: 35.6 % — ABNORMAL LOW (ref 39.0–52.0)
Hemoglobin: 11.6 g/dL — ABNORMAL LOW (ref 13.0–17.0)
Immature Granulocytes: 0 %
Lymphocytes Relative: 37 %
Lymphs Abs: 2.5 10*3/uL (ref 0.7–4.0)
MCH: 30.1 pg (ref 26.0–34.0)
MCHC: 32.6 g/dL (ref 30.0–36.0)
MCV: 92.5 fL (ref 80.0–100.0)
Monocytes Absolute: 0.6 10*3/uL (ref 0.1–1.0)
Monocytes Relative: 9 %
Neutro Abs: 3.4 10*3/uL (ref 1.7–7.7)
Neutrophils Relative %: 51 %
Platelets: 327 10*3/uL (ref 150–400)
RBC: 3.85 MIL/uL — ABNORMAL LOW (ref 4.22–5.81)
RDW: 14.8 % (ref 11.5–15.5)
WBC: 6.8 10*3/uL (ref 4.0–10.5)
nRBC: 0 % (ref 0.0–0.2)

## 2018-05-19 LAB — COMPREHENSIVE METABOLIC PANEL
ALT: 18 U/L (ref 0–44)
AST: 27 U/L (ref 15–41)
Albumin: 3.9 g/dL (ref 3.5–5.0)
Alkaline Phosphatase: 73 U/L (ref 38–126)
Anion gap: 9 (ref 5–15)
BUN: 7 mg/dL (ref 6–20)
CO2: 21 mmol/L — ABNORMAL LOW (ref 22–32)
CREATININE: 0.76 mg/dL (ref 0.61–1.24)
Calcium: 8.5 mg/dL — ABNORMAL LOW (ref 8.9–10.3)
Chloride: 106 mmol/L (ref 98–111)
GFR calc Af Amer: >60 mL/min (ref >60)
GFR calc non Af Amer: >60 mL/min (ref >60)
Glucose, Bld: 93 mg/dL (ref 70–99)
Potassium: 3.6 mmol/L (ref 3.5–5.1)
Sodium: 136 mmol/L (ref 135–145)
Total Bilirubin: 0.4 mg/dL (ref 0.3–1.2)
Total Protein: 7.4 g/dL (ref 6.5–8.1)

## 2018-05-19 LAB — BLOOD GAS, ARTERIAL: PATIENT TEMPERATURE: 37

## 2018-05-19 MED ORDER — HYDROCODONE-ACETAMINOPHEN 5-325 MG PO TABS
2.0000 | ORAL_TABLET | Freq: Once | ORAL | Status: AC
  Administered 2018-05-19: 2 via ORAL
  Filled 2018-05-19: qty 2

## 2018-05-19 MED ORDER — HYDROMORPHONE HCL 1 MG/ML IJ SOLN
2.0000 mg | Freq: Once | INTRAMUSCULAR | Status: AC
  Administered 2018-05-19: 2 mg via INTRAVENOUS

## 2018-05-19 MED ORDER — LIDOCAINE HCL (PF) 1 % IJ SOLN
INTRAMUSCULAR | Status: AC
  Filled 2018-05-19: qty 5

## 2018-05-19 MED ORDER — HYDROMORPHONE HCL 1 MG/ML IJ SOLN: 2.0000 mg | Freq: Once | INTRAMUSCULAR | Status: DC

## 2018-05-19 MED ORDER — BUPIVACAINE HCL (PF) 0.5 % IJ SOLN
30.0000 mL | Freq: Once | INTRAMUSCULAR | Status: AC
  Administered 2018-05-19: 30 mL

## 2018-05-19 MED ORDER — PROPOFOL 10 MG/ML IV BOLUS
INTRAVENOUS | Status: AC
  Filled 2018-05-19: qty 20

## 2018-05-19 MED ORDER — PROPOFOL 10 MG/ML IV BOLUS: 0.5000 mg/kg | Freq: Once | INTRAVENOUS | Status: DC

## 2018-05-19 MED ORDER — HYDROMORPHONE HCL 1 MG/ML IJ SOLN
INTRAMUSCULAR | Status: AC
  Administered 2018-05-19: 2 mg via INTRAVENOUS
  Filled 2018-05-19: qty 2

## 2018-05-19 MED ORDER — PHENYLEPHRINE 200 MCG/ML FOR PRIAPISM / HYPOTENSION
500.0000 ug | Freq: Once | INTRAMUSCULAR | Status: AC
  Administered 2018-05-19: 500 ug via INTRACAVERNOUS
  Filled 2018-05-19 (×2): qty 50

## 2018-05-19 MED ORDER — PROPOFOL 10 MG/ML IV BOLUS
200.0000 mg | Freq: Once | INTRAVENOUS | Status: AC
  Administered 2018-05-19: 50 mg via INTRAVENOUS
  Administered 2018-05-19: 100 mg via INTRAVENOUS

## 2018-05-19 MED ORDER — SODIUM CHLORIDE 0.9 % IV BOLUS
1000.0000 mL | Freq: Once | INTRAVENOUS | Status: AC
  Administered 2018-05-19: 1000 mL via INTRAVENOUS

## 2018-05-19 MED ORDER — HYDROCODONE-ACETAMINOPHEN 5-325 MG PO TABS: 1.0000 | ORAL_TABLET | Freq: Four times a day (QID) | ORAL | 0 refills | Status: DC | PRN

## 2018-05-19 NOTE — ED Provider Notes (Signed)
[facility] Emergency Department Provider Note  ____________________________________________   First MD Initiated Contact with Patient 05/19/18 [phone]     (approximate)  I have reviewed the triage vital signs and the nursing notes.   HISTORY  Chief Complaint Penis Pain    HPI Bill Hammond is a 42 y.o. male comes to the emergency department with 8 hours of painful erection.  He was seen in our emergency department 2 days ago for priapism requiring phenylephrine injection.  His symptoms recurred acutely 8 hours ago.  He took a total of 6 tablets of Sudafed at home and drank several shots of alcohol hoping to help it go down with no success.  He denies taking any phosphodiesterase inhibitors and he denies using any intracavernosal injections.  He says he has had priapism's multiple times over the years although has never been seen by urology.  He denies history of sickle cell.  His symptoms came on suddenly or gradually progressive and are now severe.  His pain is sharp and aching throughout his penis.  It is nonradiating.  He denies trauma.   Is now unable to urinate.   Past Medical History:  Diagnosis Date  . ETOH abuse   . Hypertension   . Priapism     Patient Active Problem List   Diagnosis Date Noted  . Priapism, drug-induced   . Priapism     History reviewed. No pertinent surgical history.  Prior to Admission medications   Medication Sig Start Date End Date Taking? Authorizing Provider  aspirin 325 MG tablet Take 325 mg by mouth daily as needed for mild pain.   Yes [provider]  lisinopril-hydrochlorothiazide (PRINZIDE,ZESTORETIC) 10-12.5 MG tablet Take 1 tablet by mouth daily. 04/29/18  Yes Triplett, Cari B, FNP  HYDROcodone-acetaminophen (NORCO) 5-325 MG tablet Take 1 tablet by mouth every 6 (six) hours as needed for up to 7 doses for severe pain. 05/19/18   Merrily Brittleifenbark, Emmah Bratcher, MD  methylPREDNISolone (MEDROL) 4 MG tablet Take 1 tablet  (4 mg total) by mouth daily. Take as directed Patient not taking: Reported on 05/19/2018 09/26/17   Evon SlackGaines, Thomas C, PA-C    Allergies Patient has no known allergies.  No family history on file.  Social History Social History   Tobacco Use  . Smoking status: Current Every Day Smoker    Packs/day: 1.00    Types: Cigarettes  . Smokeless tobacco: Never Used  Substance Use Topics  . Alcohol use: Yes    Comment: drinks 2 40 ounces a day  . 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 ENT: No sore throat. Cardiovascular: Denies chest pain. Respiratory: Denies shortness of breath. Gastrointestinal: No abdominal pain.  No nausea, no vomiting.  No diarrhea.  No constipation. Genitourinary: Positive for penile pain Musculoskeletal: Negative for back pain. Skin: Negative for rash. Neurological: Negative for headaches, focal weakness or numbness.   ____________________________________________   PHYSICAL EXAM:  VITAL SIGNS: ED Triage Vitals [05/19/18 0510]  Enc Vitals Group     BP (!) 148/116     Pulse Rate 94     Resp 18     Temp 97.9 F (36.6 C)     Temp Source Oral     SpO2 100 %     Weight 279 lb (126.6 kg)     Height 6' (1.829 m)  Head Circumference      Peak Flow      Pain Score 10     Pain Loc      Pain Edu?      Excl. in GC?     Constitutional: Alert and oriented x4 very anxious appearing hyperventilating and tearful Eyes: PERRL EOMI. Head: Atraumatic. Nose: No congestion/rhinnorhea. Mouth/Throat: No trismus Neck: No stridor.   Cardiovascular: Tachycardic rate, regular rhythm. Grossly normal heart sounds.  Good peripheral circulation. Respiratory: Increased respiratory effort.  No retractions. Lungs CTAB and moving good air Gastrointestinal: Soft nontender GU: Circumcised phallus with sustained priapism.  Normal testes and normal lie.  No lesions  noted Musculoskeletal: No lower extremity edema   Neurologic:  Normal speech and language. No gross focal neurologic deficits are appreciated. Skin:  Skin is warm, dry and intact. No rash noted. Psychiatric: Very anxious appearing    ____________________________________________   DIFFERENTIAL includes but not limited to  Low flow priapism, high flow priapism, ischemic penis ____________________________________________   LABS (all labs ordered are listed, but only abnormal results are displayed)  Labs Reviewed  BLOOD GAS, ARTERIAL - Abnormal; Notable for the following components:      Result Value   pH, Arterial <6.900 (*)    pCO2 arterial >120.0 (*)    pO2, Arterial <31.0 (*)    All other components within normal limits  COMPREHENSIVE METABOLIC PANEL - Abnormal; Notable for the following components:   CO2 21 (*)    Calcium 8.5 (*)    All other components within normal limits  CBC WITH DIFFERENTIAL/PLATELET - Abnormal; Notable for the following components:   RBC 3.85 (*)    Hemoglobin 11.6 (*)    HCT 35.6 (*)    All other components within normal limits    Lab work reviewed by me with very low pH and high CO2 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  No evidence of sickle cell anemia __________________________________________  EKG   ____________________________________________  RADIOLOGY   ____________________________________________   PROCEDURES  Procedure(s) performed: Yes  .Sedation Date/Time: 05/19/2018 5:48 AM Performed by: Merrily Brittleifenbark, Najib Colmenares, MD Authorized by: Merrily Brittleifenbark, Jake Fuhrmann, MD   Consent:    Consent obtained:  Verbal and emergent situation   Consent given by:  Patient   Risks discussed:  Allergic reaction, dysrhythmia, inadequate sedation, nausea, prolonged hypoxia resulting in organ damage, prolonged sedation necessitating reversal, respiratory compromise necessitating ventilatory assistance and intubation and vomiting    Alternatives discussed:  Analgesia without sedation, anxiolysis and regional anesthesia Universal protocol:    Procedure explained and questions answered to patient or proxy's satisfaction: yes     Relevant documents present and verified: yes     Test results available and properly labeled: yes     Imaging studies available: yes     Required blood products, implants, devices, and special equipment available: yes     Site/side marked: yes     Immediately prior to procedure a time out was called: yes     Patient identity confirmation method:  Verbally with patient Indications:    Procedure necessitating sedation performed by:  Physician performing sedation Pre-sedation assessment:    Time since last food or drink:  6 hours   ASA classification: class 1 - normal, healthy patient     Neck mobility: normal     Mouth opening:  3 or more finger widths   Thyromental distance:  3 finger widths   Mallampati score:  I - soft palate, uvula, fauces,  pillars visible   Pre-sedation assessments completed and reviewed: airway patency, cardiovascular function, hydration status, mental status, nausea/vomiting, pain level, respiratory function and temperature     Pre-sedation assessment completed:  05/19/2018 5:30 AM Immediate pre-procedure details:    Reassessment: Patient reassessed immediately prior to procedure     Reviewed: vital signs, relevant labs/tests and NPO status     Verified: bag valve mask available, emergency equipment available, intubation equipment available, IV patency confirmed, oxygen available and suction available   Procedure details (see MAR for exact dosages):    Preoxygenation:  Nasal cannula   Sedation:  Propofol   Intra-procedure monitoring:  Blood pressure monitoring, cardiac monitor, continuous pulse oximetry, frequent LOC assessments, frequent vital sign checks and continuous capnometry   Intra-procedure events: none     Total Provider sedation time (minutes):   18 Post-procedure details:    Post-sedation assessment completed:  05/19/2018 7:00 AM   Attendance: Constant attendance by certified staff until patient recovered     Recovery: Patient returned to pre-procedure baseline     Post-sedation assessments completed and reviewed: airway patency, cardiovascular function, hydration status, mental status, nausea/vomiting, pain level, respiratory function and temperature     Patient is stable for discharge or admission: yes     Patient tolerance:  Tolerated well, no immediate complications .Critical Care Performed by: Merrily Brittle, MD Authorized by: Merrily Brittle, MD   Critical care provider statement:    Critical care time (minutes):  35   Critical care time was exclusive of:  Separately billable procedures and treating other patients   Critical care was necessary to treat or prevent imminent or life-threatening deterioration of the following conditions: 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   Critical care was time spent personally by me on the following activities:  Development of treatment plan with patient or surrogate, discussions with consultants, evaluation of patient's response to treatment, examination of patient, obtaining history from patient or surrogate, ordering and performing treatments and interventions, ordering and review of laboratory studies, ordering and review of radiographic studies, pulse oximetry, re-evaluation of patient's condition and review of old charts .Nerve Block Date/Time: 05/19/2018 5:48 AM Performed by: Merrily Brittle, MD Authorized by: Merrily Brittle, MD   Consent:    Consent obtained:  Verbal   Consent given by:  Patient   Risks discussed:  Pain, unsuccessful block, swelling and bleeding   Alternatives discussed:  Alternative treatment Indications:    Indications:  Pain relief and procedural anesthesia Location:    Nerve block body site: Dorsal nerve block of his  penis. Pre-procedure details:    Skin preparation:  Alcohol Skin anesthesia (see MAR for exact dosages):    Skin anesthesia method:  None Procedure details (see MAR for exact dosages):    Block needle gauge:  25 G   Anesthetic injected:  Bupivacaine 0.5% w/o epi   Steroid injected:  None   Additive injected:  None   Injection procedure:  Anatomic landmarks identified, anatomic landmarks palpated, incremental injection and negative aspiration for blood   Paresthesia:  None Post-procedure details:    Dressing:  None   Outcome:  Anesthesia achieved   Patient tolerance of procedure:  Tolerated well, no immediate complications Comments:     I used a total of 3 cc of 0.5% bupivacaine using a 25-gauge needle after the patient was given propofol for procedural sedation.  I used half of the bupivacaine on each side of the base of the penis on  the dorsal nerve block achieving complete anesthesia Irrigate corpus cavern, priapism Date/Time: 05/19/2018 5:45 AM Performed by: Merrily Brittle, MD Authorized by: Merrily Brittle, MD  Consent: The procedure was performed in an emergent situation. Verbal consent obtained. Written consent not obtained. Risks and benefits: risks, benefits and alternatives were discussed Consent given by: patient Patient understanding: patient states understanding of the procedure being performed Required items: required blood products, implants, devices, and special equipment available Patient identity confirmed: verbally with patient, arm band, provided demographic data and hospital-assigned identification number Time out: Immediately prior to procedure a "time out" was called to verify the correct patient, procedure, equipment, support staff and site/side marked as required. Preparation: Patient was prepped and draped in the usual sterile fashion. Local anesthesia used: yes Anesthesia: nerve block  Anesthesia: Local anesthesia used: yes  Sedation: Patient sedated:  yes Sedation type: (Deep sedation with propofol) Sedatives: propofol Analgesia: hydromorphone Vitals: Vital signs were monitored during sedation.  Patient tolerance: Patient tolerated the procedure well with no immediate complications Comments: The patient was sedated with propofol and after sedation I performed a dorsal nerve block.  I then drew a venous blood gas and following this injected his penis with phenylephrine a total of 6 times achieving complete detumescence     Critical Care performed: Yes  ____________________________________________   INITIAL IMPRESSION / ASSESSMENT AND PLAN / ED COURSE  Pertinent labs & imaging results that were available during my care of the patient were reviewed by me and considered in my medical decision making (see chart for details).   As part of my medical decision making, I reviewed the following data within the electronic MEDICAL RECORD NUMBER History obtained from family if available, nursing notes, old chart and ekg, as well as notes from prior ED visits.  The patient comes to the emergency department with 8 hours of painful priapism.  This is his second priapism in the last 48 hours and roughly his fifth or sixth lifetime event.  He has never followed up with urology.  The patient is understandably frightened about the treatment and is very wary of needles.  He was initially given IV Dilaudid for pain which did seem to help however he asked to be fully sedated for the procedure which I think is entirely reasonable.  Decision was made not to perform formal written consent given the acute ischemic nature of his priapism and that it had already been ongoing for 8 hours and any further delay would risk future function of his penis.  Consent was obtained with nurses Demetrio Lapping and [place] present.  I then gave him deep sedation with propofol while monitoring his airway with usual precautions.  I then performed a dorsal nerve block with bupivacaine and drew a  penis blood gas.  I then injected his penis a total of 6 times with phenylephrine achieving complete detumescence and then wrapped his penis with an Ace wrap compression dressing.  I then spoke with on-call urologist Dr. Alvester Morin who agrees with the therapy that we provided and has encouraged the patient to follow-up in clinic this week.  Should the patient's priapism recur he is more than happy to come into the hospital to evaluate the patient today.  Following the procedure the patient's pain is adequately controlled and his priapism did not recur.  I will prescribe him a short course of hydrocodone for home and help him follow-up in clinic this week.  Strict return precautions have been given.  He was able to urinate prior  to discharge.      ____________________________________________   FINAL CLINICAL IMPRESSION(S) / ED DIAGNOSES  Final diagnoses:  Priapism  Urinary retention      NEW MEDICATIONS STARTED DURING THIS VISIT:  Discharge Medication List as of 05/19/2018  6:37 AM       Note:  This document was prepared using Dragon voice recognition software and may include unintentional dictation errors.    Merrily Brittle, MD 05/23/18 2226

## 2018-05-19 NOTE — ED Triage Notes (Signed)
Patient states that he has a priapism times 8 hours. patient states that he was seen here for the same on Saturday. Patient states that he has been unable to urinate.

## 2018-05-19 NOTE — ED Notes (Signed)
Verbal consent was given by Bill Hammond to Dr. Lamont Snowballifenbark for sedation to correct issue with penis.

## 2018-05-19 NOTE — Discharge Instructions (Signed)
It is critically important that you call the Urology office THIS MORNING to schedule your appointment.  You must be seen by a Urologist this week for a recheck to make sure this stops occurring.  Return to the ED sooner for any concerns whatsoever.  It was a pleasure to take care of you today, and thank you for coming to our emergency department.  If you have any questions or concerns before leaving please ask the nurse to grab me and I'm more than happy to go through your aftercare instructions again.  If you were prescribed any opioid pain medication today such as Norco, Vicodin, Percocet, morphine, hydrocodone, or oxycodone please make sure you do not drive when you are taking this medication as it can alter your ability to drive safely.  If you have any concerns once you are home that you are not improving or are in fact getting worse before you can make it to your follow-up appointment, please do not hesitate to call 911 and come back for further evaluation.  Bill BrittleNeil Antoino Westhoff, MD  Results for orders placed or performed during the hospital encounter of 05/19/18  Comprehensive metabolic panel  Result Value Ref Range   Sodium 136 135 - 145 mmol/L   Potassium 3.6 3.5 - 5.1 mmol/L   Chloride 106 98 - 111 mmol/L   CO2 21 (L) 22 - 32 mmol/L   Glucose, Bld 93 70 - 99 mg/dL   BUN 7 6 - 20 mg/dL   Creatinine, Ser 1.610.76 0.61 - 1.24 mg/dL   Calcium 8.5 (L) 8.9 - 10.3 mg/dL   Total Protein 7.4 6.5 - 8.1 g/dL   Albumin 3.9 3.5 - 5.0 g/dL   AST 27 15 - 41 U/L   ALT 18 0 - 44 U/L   Alkaline Phosphatase 73 38 - 126 U/L   Total Bilirubin 0.4 0.3 - 1.2 mg/dL   GFR calc non Af Amer >60 >60 mL/min   GFR calc Af Amer >60 >60 mL/min   Anion gap 9 5 - 15  CBC with Differential  Result Value Ref Range   WBC 6.8 4.0 - 10.5 K/uL   RBC 3.85 (L) 4.22 - 5.81 MIL/uL   Hemoglobin 11.6 (L) 13.0 - 17.0 g/dL   HCT 09.635.6 (L) 04.539.0 - 40.952.0 %   MCV 92.5 80.0 - 100.0 fL   MCH 30.1 26.0 - 34.0 pg   MCHC 32.6 30.0 -  36.0 g/dL   RDW 81.114.8 91.411.5 - 78.215.5 %   Platelets 327 150 - 400 K/uL   nRBC 0.0 0.0 - 0.2 %   Neutrophils Relative % 51 %   Neutro Abs 3.4 1.7 - 7.7 K/uL   Lymphocytes Relative 37 %   Lymphs Abs 2.5 0.7 - 4.0 K/uL   Monocytes Relative 9 %   Monocytes Absolute 0.6 0.1 - 1.0 K/uL   Eosinophils Relative 3 %   Eosinophils Absolute 0.2 0.0 - 0.5 K/uL   Basophils Relative 0 %   Basophils Absolute 0.0 0.0 - 0.1 K/uL   Immature Granulocytes 0 %   Abs Immature Granulocytes 0.03 0.00 - 0.07 K/uL   While here in the ER today you received very powerful medicine that makes it unsafe for you to drive for the rest of the day.  Do not drive until tomorrow.

## 2018-05-24 ENCOUNTER — Emergency Department
Admission: EM | Admit: 2018-05-24 | Discharge: 2018-05-24 | Disposition: A | Discharge status: Home / Self Care | Payer: Self-pay | Attending: Emergency Medicine | Admitting: Emergency Medicine

## 2018-05-24 ENCOUNTER — Encounter: Payer: Self-pay | Admitting: *Deleted

## 2018-05-24 ENCOUNTER — Other Ambulatory Visit: Payer: Self-pay

## 2018-05-24 DIAGNOSIS — Z79899 Other long term (current) drug therapy: Secondary | ICD-10-CM | POA: Insufficient documentation

## 2018-05-24 DIAGNOSIS — I10 Essential (primary) hypertension: Secondary | ICD-10-CM | POA: Insufficient documentation

## 2018-05-24 DIAGNOSIS — N4839 Other priapism: Secondary | ICD-10-CM

## 2018-05-24 DIAGNOSIS — N4830 Priapism, unspecified: Secondary | ICD-10-CM | POA: Insufficient documentation

## 2018-05-24 DIAGNOSIS — F17210 Nicotine dependence, cigarettes, uncomplicated: Secondary | ICD-10-CM | POA: Insufficient documentation

## 2018-05-24 LAB — CBC
HCT: 36.1 % — ABNORMAL LOW (ref 39.0–52.0)
Hemoglobin: 11.8 g/dL — ABNORMAL LOW (ref 13.0–17.0)
MCH: 29.6 pg (ref 26.0–34.0)
MCHC: 32.7 g/dL (ref 30.0–36.0)
MCV: 90.5 fL (ref 80.0–100.0)
Platelets: 359 10*3/uL (ref 150–400)
RBC: 3.99 MIL/uL — ABNORMAL LOW (ref 4.22–5.81)
RDW: 15.2 % (ref 11.5–15.5)
WBC: 7.1 10*3/uL (ref 4.0–10.5)
nRBC: 0 % (ref 0.0–0.2)

## 2018-05-24 LAB — COMPREHENSIVE METABOLIC PANEL
ALT: 18 U/L (ref 0–44)
ANION GAP: 10 (ref 5–15)
AST: 22 U/L (ref 15–41)
Albumin: 4 g/dL (ref 3.5–5.0)
Alkaline Phosphatase: 79 U/L (ref 38–126)
BUN: 10 mg/dL (ref 6–20)
CO2: 22 mmol/L (ref 22–32)
Calcium: 8.8 mg/dL — ABNORMAL LOW (ref 8.9–10.3)
Chloride: 100 mmol/L (ref 98–111)
Creatinine, Ser: 0.79 mg/dL (ref 0.61–1.24)
GFR calc Af Amer: >60 mL/min (ref >60)
GFR calc non Af Amer: >60 mL/min (ref >60)
Glucose, Bld: 99 mg/dL (ref 70–99)
POTASSIUM: 4 mmol/L (ref 3.5–5.1)
SODIUM: 132 mmol/L — AB (ref 135–145)
Total Bilirubin: 0.5 mg/dL (ref 0.3–1.2)
Total Protein: 7.7 g/dL (ref 6.5–8.1)

## 2018-05-24 MED ORDER — PROPOFOL 10 MG/ML IV BOLUS
50.0000 mg | Freq: Once | INTRAVENOUS | Status: AC
  Administered 2018-05-24: 50 mg via INTRAVENOUS

## 2018-05-24 MED ORDER — PHENYLEPHRINE 200 MCG/ML FOR PRIAPISM / HYPOTENSION
200.0000 ug | Freq: Once | INTRAMUSCULAR | Status: AC
  Administered 2018-05-24: 200 ug via INTRACAVERNOUS
  Filled 2018-05-24 (×2): qty 50

## 2018-05-24 MED ORDER — HYDROMORPHONE HCL 1 MG/ML IJ SOLN
1.0000 mg | Freq: Once | INTRAMUSCULAR | Status: AC
  Administered 2018-05-24: 1 mg via INTRAVENOUS
  Filled 2018-05-24: qty 1

## 2018-05-24 MED ORDER — OXYCODONE-ACETAMINOPHEN 5-325 MG PO TABS
1.0000 | ORAL_TABLET | Freq: Once | ORAL | Status: AC
  Administered 2018-05-24: 1 via ORAL
  Filled 2018-05-24: qty 1

## 2018-05-24 MED ORDER — LIDOCAINE HCL (PF) 1 % IJ SOLN
INTRAMUSCULAR | Status: AC
  Filled 2018-05-24: qty 5

## 2018-05-24 MED ORDER — LIDOCAINE HCL (PF) 1 % IJ SOLN
30.0000 mL | Freq: Once | INTRAMUSCULAR | Status: AC
  Administered 2018-05-24: 30 mL
  Filled 2018-05-24 (×2): qty 30

## 2018-05-24 MED ORDER — PROPOFOL 10 MG/ML IV BOLUS
100.0000 mg | Freq: Once | INTRAVENOUS | Status: AC
  Administered 2018-05-24: 100 mg via INTRAVENOUS

## 2018-05-24 MED ORDER — PROPOFOL 10 MG/ML IV BOLUS: 0.5000 mg/kg | Freq: Once | INTRAVENOUS | Status: DC

## 2018-05-24 MED ORDER — PROPOFOL 1000 MG/100ML IV EMUL
INTRAVENOUS | Status: AC
  Administered 2018-05-24: 100 mg via INTRAVENOUS
  Filled 2018-05-24: qty 100

## 2018-05-24 NOTE — ED Provider Notes (Signed)
[facility] Emergency Department Provider Note ____________________   First MD Initiated Contact with Patient 05/24/18 [phone]     (approximate)  I have reviewed the triage vital signs and the nursing notes.   HISTORY  Chief Complaint Penis Pain   HPI Bill Hammond is a 42 y.o. male with below list of chronic medical conditions including previous episodes of priapism including on December 23 where the patient was seen here in the emergency department returns to the emergency department with persistent painful erection x10 hours.  Patient states that he was in route to the emergency department where he was pulled over by the police and taken into custody because he does not have a license which delayed his getting to the emergency department.  Patient admits to 10 out of 10 penile pain.  Patient denies aching any phosphodiesterase inhibitors or intracavernosal injections.  Patient denies any history of sickle cell anemia.  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  Past Medical History:  Diagnosis Date  . ETOH abuse   . Hypertension   . Priapism     Patient Active Problem List   Diagnosis Date Noted  . Priapism, drug-induced   . Priapism     History reviewed. No pertinent surgical history.  Prior to Admission medications   Medication Sig Start Date End Date Taking? Authorizing Provider  aspirin 325 MG tablet Take 325 mg by mouth daily as needed for mild pain.    [provider]  HYDROcodone-acetaminophen (NORCO) 5-325 MG tablet Take 1 tablet by mouth every 6 (six) hours as needed for up to 7 doses for severe pain. 05/19/18   Merrily Brittleifenbark, Neil, MD  lisinopril-hydrochlorothiazide (PRINZIDE,ZESTORETIC) 10-12.5 MG tablet Take 1 tablet by mouth daily. 04/29/18   Triplett, Cari B, FNP  methylPREDNISolone (MEDROL) 4 MG tablet Take 1 tablet (4 mg total) by mouth daily. Take as directed Patient not taking: Reported on 05/19/2018 09/26/17    Evon SlackGaines, Thomas C, PA-C    Allergies No known drug allergies No family history on file.  Social History Social History   Tobacco Use  . Smoking status: Current Every Day Smoker    Packs/day: 1.00    Types: Cigarettes  . Smokeless tobacco: Never Used  Substance Use Topics  . Alcohol use: Yes    Comment: drinks 2 40 ounces a day  . 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 ENT: No sore throat. Cardiovascular: Denies chest pain. Respiratory: Denies shortness of breath. Gastrointestinal: No abdominal pain.  No nausea, no vomiting.  No diarrhea.  No constipation. Genitourinary: Positive for painful erection x10 hours Musculoskeletal: Negative for neck pain.  Negative for back pain. Integumentary: Negative for rash. Neurological: Negative for headaches, focal weakness or numbness.   ____________________________________________   PHYSICAL EXAM:  VITAL SIGNS: ED Triage Vitals  Enc Vitals Group     BP 05/24/18 0520 (!) 150/112     Pulse Rate 05/24/18 0520 (!) 124     Resp 05/24/18 0520 20     Temp 05/24/18 0520 98.2 F (36.8 C)     Temp Source 05/24/18 0520 Oral     SpO2 05/24/18 0520 98 %     Weight --      Height --      Head Circumference --      Peak Flow --  Pain Score 05/24/18 0522 10     Pain Loc --      Pain Edu? --      Excl. in GC? --     Constitutional: Alert and oriented.  Apparent discomfort  eyes: Conjunctivae are normal.  Mouth/Throat: Mucous membranes are moist. Oropharynx non-erythematous. Neck: No stridor.   Cardiovascular: Normal rate, regular rhythm. Good peripheral circulation. Grossly normal heart sounds. Respiratory: Normal respiratory effort.  No retractions. Lungs CTAB. Gastrointestinal: Soft and nontender. No distention.  Genitourinary: Penile erection noted. Musculoskeletal: No lower extremity tenderness nor edema. No gross  deformities of extremities. Neurologic:  Normal speech and language. No gross focal neurologic deficits are appreciated.  Skin:  Skin is warm, dry and intact. No rash noted. Psychiatric: Mood and affect are normal. Speech and behavior are normal.  ____________________________________________   LABS (all labs ordered are listed, but only abnormal results are displayed)  Labs Reviewed  CBC - Abnormal; Notable for the following components:      Result Value   RBC 3.99 (*)    Hemoglobin 11.8 (*)    HCT 36.1 (*)    All other components within normal limits  COMPREHENSIVE METABOLIC PANEL - Abnormal; Notable for the following components:   Sodium 132 (*)    Calcium 8.8 (*)    All other components within normal limits   _________________________   Procedure(s) performed:   .Sedation Date/Time: 05/24/2018 10:36 PM Performed by: Darci CurrentBrown, Grayson N, MD Authorized by: Darci CurrentBrown, [place] N, MD   Consent:    Consent obtained:  Verbal   Consent given by:  Patient   Risks discussed:  Allergic reaction, dysrhythmia, inadequate sedation, nausea, prolonged hypoxia resulting in organ damage, prolonged sedation necessitating reversal, respiratory compromise necessitating ventilatory assistance and intubation and vomiting   Alternatives discussed:  Analgesia without sedation, anxiolysis and regional anesthesia Universal protocol:    Procedure explained and questions answered to patient or proxy's satisfaction: yes     Relevant documents present and verified: yes     Test results available and properly labeled: yes     Imaging studies available: yes     Required blood products, implants, devices, and special equipment available: yes     Site/side marked: yes     Immediately prior to procedure a time out was called: yes     Patient identity confirmation method:  Verbally with patient Indications:    Procedure necessitating sedation performed by:  Physician performing sedation Pre-sedation  assessment:    Time since last food or drink:  12 hours   ASA classification: class 1 - normal, healthy patient     Neck mobility: normal     Mouth opening:  3 or more finger widths   Thyromental distance:  4 finger widths   Mallampati score:  I - soft palate, uvula, fauces, pillars visible   Pre-sedation assessments completed and reviewed: airway patency, cardiovascular function, hydration status, mental status, nausea/vomiting, pain level, respiratory function and temperature   Immediate pre-procedure details:    Reassessment: Patient reassessed immediately prior to procedure     Reviewed: vital signs, relevant labs/tests and NPO status     Verified: bag valve mask available, emergency equipment available, intubation equipment available, IV patency confirmed, oxygen available and suction available   Procedure details (see MAR for exact dosages):    Preoxygenation:  Nasal cannula   Sedation:  Propofol   Intra-procedure monitoring:  Blood pressure monitoring, cardiac monitor, continuous pulse oximetry, frequent LOC assessments, frequent vital sign checks and  continuous capnometry   Intra-procedure events: none     Total Provider sedation time (minutes):  25 Post-procedure details:    Attendance: Constant attendance by certified staff until patient recovered     Recovery: Patient returned to pre-procedure baseline     Post-sedation assessments completed and reviewed: airway patency, cardiovascular function, hydration status, mental status, nausea/vomiting, pain level, respiratory function and temperature     Patient is stable for discharge or admission: yes     Patient tolerance:  Tolerated well, no immediate complications .Critical Care Performed by: Darci Current, MD Authorized by: Darci Current, MD   Critical care provider statement:    Critical care time (minutes):  45   Critical care time was exclusive of:  Separately billable procedures and treating other patients  (Priapism)   Critical care was time spent personally by me on the following activities:  Development of treatment plan with patient or surrogate, discussions with consultants, evaluation of patient's response to treatment, examination of patient, obtaining history from patient or surrogate, ordering and performing treatments and interventions, ordering and review of laboratory studies, ordering and review of radiographic studies, pulse oximetry, re-evaluation of patient's condition and review of old charts     ____________________________________________   INITIAL IMPRESSION / ASSESSMENT AND PLAN / ED COURSE  As part of my medical decision making, I reviewed the following data within the electronic MEDICAL RECORD NUMBER   42 year old male presenting with above-stated history and physical exam secondary to priapism for 10 hours.  Patient given IV Dilaudid in total 2 mg with some pain improvement however pain persists.  Patient immediately discussed with Dr. Richardo Hanks urologist on-call who promptly presented to the emergency department.  I assisted Dr. Irish Elders by performing conscious sedation for the patient.  Priapism was resolved following Winter shunt with moderate sedation during the procedure. ____________________________________________  FINAL CLINICAL IMPRESSION(S) / ED DIAGNOSES  Final diagnoses:  Priapism     MEDICATIONS GIVEN DURING THIS VISIT:  Medications  phenylephrine 200 mcg / ml CONC. DILUTION INJ (ED / Urology USE ONLY) (has no administration in time range)  lidocaine (PF) (XYLOCAINE) 1 % injection 30 mL (has no administration in time range)  HYDROmorphone (DILAUDID) injection 1 mg (1 mg Intravenous Given 05/24/18 0548)  HYDROmorphone (DILAUDID) injection 1 mg (1 mg Intravenous Given 05/24/18 1610)     ED Discharge Orders    None       Note:  This document was prepared using Dragon voice recognition software and may include unintentional dictation errors.    Darci Current, MD 05/24/18 2240

## 2018-05-24 NOTE — ED Triage Notes (Signed)
Pt reports priapism for about 8 hours. Seen the same here recently for the same.

## 2018-05-24 NOTE — H&P (View-Only) (Signed)
05/24/2018 8:59 AM   Gasper Lloydesmond T Knipple 01/31/1976 [id number]  CC: Priapism  HPI: I saw [name] in the emergency department in consultation for priapism from Dr. Manson PasseyBrown.  He is a 42 year old African-American male with multiple prior episodes of priapism who presents with an erection lasting 10 to 12 hours.  This is my first time ever meeting the patient.  He reports usually he has 1-2 episodes of priapism per year.  He has been worked up for sickle cell and other coagulation disorders which have reportedly all been negative.  He was seen twice in the last week for ischemic priapism with blood gas confirmation, and underwent phenylephrine injection on 12/21 as well as on 12/23 in the emergency department.  He denies any new medications, including PDE 5 inhibitors or penile injections.  He does use alcohol and smoke marijuana.  He denies any cocaine use or other illicit drugs.  He reports he is having severe penile pain, 10/10.  There are no aggravating or alleviating factors.   He adamantly refuses any intervention without sedation.   PMH: Past Medical History:  Diagnosis Date  . ETOH abuse   . Hypertension   . Priapism     Surgical History: History reviewed. No pertinent surgical history.   Allergies: No Known Allergies  Family History: No family history on file.  Social History:  Patient reports EtOH use and marijuana use.  Denies cocaine or other illicit drugs.  ROS: Negative aside from those stated in the HPI  Physical Exam: BP (!) 149/107   Pulse 95   Temp 98.3 F (36.8 C)   Resp 18   SpO2 98%    Constitutional: Anxious, appears uncomfortable Cardiovascular: No clubbing, cyanosis, or edema. Respiratory: Normal respiratory effort, no increased work of breathing. GI: Abdomen is soft, nontender, nondistended, no abdominal masses GU: No CVA tenderness, circumcised erect phallus, exquisitely tender Lymph: No cervical or inguinal lymphadenopathy. Skin: No rashes,  bruises or suspicious lesions. Neurologic: Grossly intact, no focal deficits, moving all 4 extremities. Psychiatric: Anxious  Procedure: Written informed consent was obtained.  We specifically discussed the risks of bleeding, infection, and erectile dysfunction.  The patient was started on telemetry and vitals were closely monitored.  Moderate sedation was provided by the emergency department staff (Dr. Manson PasseyBrown), please see documentation in chart regarding dosing.  The patient was prepped extensively with Betadine in standard sterile fashion and sterile drapes were applied.  A penile block and ring block was performed with a total of 15 cc of 1% lidocaine without epinephrine.  In the setting of his multiple episodes of priapism in the last week, I elected to perform a Winter shunt with an 18-gauge needle.  The 18-gauge needle was inserted through the glans directly into the right corpora.  There was return of dark maroon blood consistent with ischemic priapism.  As we were trying to obtain a blood gas the patient became agitated and combative and this was not able to be sent.  In the setting of his clinical exam, history, penile pain, and dark maroon corporal blood all consistent with ischemic priapism, we elected to continue the procedure without obtaining a blood gas.  A 21-gauge butterfly was inserted into the right corpora, and a total of 5 mL of 200 mcg of phenylephrine solution were injected over a period of 15 minutes.  After observation for 10 minutes the penis remained flaccid.  All needles were removed.  There was mild bleeding from the glans, and a 4-0  Vicryl figure-of-eight stitch was placed with excellent hemostasis.  A compressive dressing was applied.  Assessment & Plan:   In summary, 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  He has reportedly undergone sickle cell and hematologic work-up before which is all been negative.  He underwent a successful Winter shunt with moderate sedation in the emergency department this morning, and the penis remained flaccid 1 hour later.  We discussed the risks of erectile dysfunction in the setting of his recurrent priapism, especially with his 10 hour episode today.  We discussed options in the future for erectile dysfunction including possible low-dose tadalafil or penile implant.  Patient must void prior to discharge Can remove compressive dressing in 24 hours at home Follow-up in 3 to 4 weeks in urology clinic, arranged  Sondra ComeBrian C Amiylah Anastos, MD  [facility] [address], [address] [place], [place] [address] [phone]

## 2018-05-24 NOTE — ED Notes (Signed)
Urologist at bedside to reassess patient.

## 2018-05-24 NOTE — Sedation Documentation (Signed)
 50mg  proprofol pushed Dr. Manson PasseyBrown at this time

## 2018-05-24 NOTE — Sedation Documentation (Signed)
 50mg  propofol pushed at this time Dr. Manson PasseyBrown.

## 2018-05-24 NOTE — ED Notes (Signed)
Consent and moderate sedation instructions signed at bedside.

## 2018-05-24 NOTE — ED Notes (Addendum)
EDP aware that pt urinated. Pt appropriate for discharge at this time.   Pt able to drink without emesis. Pt able to urinate.

## 2018-05-24 NOTE — ED Notes (Signed)
Pt verbalizes understanding of requiring successful urination prior to discharge. Pt has urinal at bedside.

## 2018-05-24 NOTE — ED Provider Notes (Signed)
-----------------------------------------   9:20 AM on 05/24/2018 -----------------------------------------  Pt was under the care of urology and dr. Manson PasseyBrown, they have reduced his priapism. He has urinated. They wish him discharged at this time. Counseled him not to drive. Return precautions and f/u given and understood.   Jeanmarie PlantMcShane, Lucillia Corson A, MD 05/24/18 [phone]

## 2018-05-24 NOTE — ED Notes (Signed)
Bladder scan revealed .

## 2018-05-24 NOTE — Consult Note (Addendum)
 05/24/2018 8:59 AM   Bill Hammond 11/16/1975 [id number]  CC: Priapism  HPI: I saw Bill Hammond in the emergency department in consultation for priapism from Dr. Brown.  He is a 42-year-old African-American male with multiple prior episodes of priapism who presents with an erection lasting 10 to 12 hours.  This is my first time ever meeting the patient.  He reports usually he has 1-2 episodes of priapism per year.  He has been worked up for sickle cell and other coagulation disorders which have reportedly all been negative.  He was seen twice in the last week for ischemic priapism with blood gas confirmation, and underwent phenylephrine injection on 12/21 as well as on 12/23 in the emergency department.  He denies any new medications, including PDE 5 inhibitors or penile injections.  He does use alcohol and smoke marijuana.  He denies any cocaine use or other illicit drugs.  He reports he is having severe penile pain, 10/10.  There are no aggravating or alleviating factors.   He adamantly refuses any intervention without sedation.   PMH: Past Medical History:  Diagnosis Date  . ETOH abuse   . Hypertension   . Priapism     Surgical History: History reviewed. No pertinent surgical history.   Allergies: No Known Allergies  Family History: No family history on file.  Social History:  Patient reports EtOH use and marijuana use.  Denies cocaine or other illicit drugs.  ROS: Negative aside from those stated in the HPI  Physical Exam: BP (!) 149/107   Pulse 95   Temp 98.3 F (36.8 C)   Resp 18   SpO2 98%    Constitutional: Anxious, appears uncomfortable Cardiovascular: No clubbing, cyanosis, or edema. Respiratory: Normal respiratory effort, no increased work of breathing. GI: Abdomen is soft, nontender, nondistended, no abdominal masses GU: No CVA tenderness, circumcised erect phallus, exquisitely tender Lymph: No cervical or inguinal lymphadenopathy. Skin: No rashes,  bruises or suspicious lesions. Neurologic: Grossly intact, no focal deficits, moving all 4 extremities. Psychiatric: Anxious  Procedure: Written informed consent was obtained.  We specifically discussed the risks of bleeding, infection, and erectile dysfunction.  The patient was started on telemetry and vitals were closely monitored.  Moderate sedation was provided by the emergency department staff (Dr. Brown), please see documentation in chart regarding dosing.  The patient was prepped extensively with Betadine in standard sterile fashion and sterile drapes were applied.  A penile block and ring block was performed with a total of 15 cc of 1% lidocaine without epinephrine.  In the setting of his multiple episodes of priapism in the last week, I elected to perform a Winter shunt with an 18-gauge needle.  The 18-gauge needle was inserted through the glans directly into the right corpora.  There was return of dark maroon blood consistent with ischemic priapism.  As we were trying to obtain a blood gas the patient became agitated and combative and this was not able to be sent.  In the setting of his clinical exam, history, penile pain, and dark maroon corporal blood all consistent with ischemic priapism, we elected to continue the procedure without obtaining a blood gas.  A 21-gauge butterfly was inserted into the right corpora, and a total of 5 mL of 200 mcg of phenylephrine solution were injected over a period of 15 minutes.  After observation for 10 minutes the penis remained flaccid.  All needles were removed.  There was mild bleeding from the glans, and a 4-0   Vicryl figure-of-eight stitch was placed with excellent hemostasis.  A compressive dressing was applied.  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  He has reportedly undergone sickle cell and hematologic work-up before which is all been negative.  He underwent a successful Winter shunt with moderate sedation in the emergency department this morning, and the penis remained flaccid 1 hour later.  We discussed the risks of erectile dysfunction in the setting of his recurrent priapism, especially with his 10 hour episode today.  We discussed options in the future for erectile dysfunction including possible low-dose tadalafil or penile implant.  Patient must void prior to discharge Can remove compressive dressing in 24 hours at home Follow-up in 3 to 4 weeks in urology clinic, arranged  Sondra ComeBrian C Nyjae Hodge, MD  [facility] [address], [address] [place], [place] [address] [phone]

## 2018-05-24 NOTE — Sedation Documentation (Signed)
 50mg  propofol pushed again at this time Dr. Manson PasseyBrown.

## 2018-05-26 ENCOUNTER — Telehealth: Payer: Self-pay | Admitting: Urology

## 2018-05-26 NOTE — Telephone Encounter (Signed)
App made ° ° °Michelle  °

## 2018-05-26 NOTE — Telephone Encounter (Signed)
-----   Message from Sondra ComeBrian C Sninsky, MD sent at 05/24/2018  9:12 AM EST ----- Regarding: follow up Please set up follow up with me in 3-4 weeks as 15 minute RTC visit.    Thanks, Legrand RamsBrian Sninsky, MD 05/24/2018

## 2018-05-28 ENCOUNTER — Other Ambulatory Visit: Payer: Self-pay

## 2018-05-28 ENCOUNTER — Encounter: Payer: Self-pay | Admitting: Emergency Medicine

## 2018-05-28 ENCOUNTER — Encounter
Admission: EM | Disposition: A | Discharge status: Home / Self Care | Payer: Self-pay | Source: Home / Self Care | Attending: Urology

## 2018-05-28 ENCOUNTER — Emergency Department: Payer: Self-pay | Admitting: Anesthesiology

## 2018-05-28 ENCOUNTER — Inpatient Hospital Stay
Admission: EM | Admit: 2018-05-28 | Discharge: 2018-05-29 | DRG: 710 | Disposition: A | Discharge status: Home / Self Care | Payer: Self-pay | Attending: Urology | Admitting: Urology

## 2018-05-28 DIAGNOSIS — F1010 Alcohol abuse, uncomplicated: Secondary | ICD-10-CM | POA: Diagnosis present

## 2018-05-28 DIAGNOSIS — N4830 Priapism, unspecified: Secondary | ICD-10-CM

## 2018-05-28 DIAGNOSIS — Z6837 Body mass index (BMI) 37.0-37.9, adult: Secondary | ICD-10-CM

## 2018-05-28 DIAGNOSIS — I10 Essential (primary) hypertension: Secondary | ICD-10-CM | POA: Diagnosis present

## 2018-05-28 DIAGNOSIS — Z7982 Long term (current) use of aspirin: Secondary | ICD-10-CM

## 2018-05-28 DIAGNOSIS — N4833 Priapism, drug-induced: Principal | ICD-10-CM | POA: Diagnosis present

## 2018-05-28 DIAGNOSIS — F1210 Cannabis abuse, uncomplicated: Secondary | ICD-10-CM | POA: Diagnosis present

## 2018-05-28 DIAGNOSIS — F17210 Nicotine dependence, cigarettes, uncomplicated: Secondary | ICD-10-CM | POA: Diagnosis present

## 2018-05-28 DIAGNOSIS — E669 Obesity, unspecified: Secondary | ICD-10-CM | POA: Diagnosis present

## 2018-05-28 DIAGNOSIS — T50905A Adverse effect of unspecified drugs, medicaments and biological substances, initial encounter: Secondary | ICD-10-CM | POA: Diagnosis present

## 2018-05-28 DIAGNOSIS — N4839 Other priapism: Secondary | ICD-10-CM

## 2018-05-28 DIAGNOSIS — Z79899 Other long term (current) drug therapy: Secondary | ICD-10-CM

## 2018-05-28 HISTORY — PX: PRIAPISM REPAIR: SHX6040

## 2018-05-28 LAB — COMPREHENSIVE METABOLIC PANEL
ALT: 22 U/L (ref 0–44)
AST: 27 U/L (ref 15–41)
Albumin: 4.3 g/dL (ref 3.5–5.0)
Alkaline Phosphatase: 92 U/L (ref 38–126)
Anion gap: 9 (ref 5–15)
BUN: 11 mg/dL (ref 6–20)
CO2: 25 mmol/L (ref 22–32)
Calcium: 9.1 mg/dL (ref 8.9–10.3)
Chloride: 98 mmol/L (ref 98–111)
Creatinine, Ser: 0.89 mg/dL (ref 0.61–1.24)
GFR calc non Af Amer: >60 mL/min (ref >60)
Glucose, Bld: 106 mg/dL — ABNORMAL HIGH (ref 70–99)
Potassium: 3.7 mmol/L (ref 3.5–5.1)
Sodium: 132 mmol/L — ABNORMAL LOW (ref 135–145)
TOTAL PROTEIN: 8.4 g/dL — AB (ref 6.5–8.1)
Total Bilirubin: 0.7 mg/dL (ref 0.3–1.2)

## 2018-05-28 LAB — CBC
HCT: 39 % (ref 39.0–52.0)
HCT: 35.2 % — ABNORMAL LOW (ref 39.0–52.0)
Hemoglobin: 12.5 g/dL — ABNORMAL LOW (ref 13.0–17.0)
Hemoglobin: 11.2 g/dL — ABNORMAL LOW (ref 13.0–17.0)
MCH: 29.8 pg (ref 26.0–34.0)
MCH: 29.7 pg (ref 26.0–34.0)
MCHC: 32.1 g/dL (ref 30.0–36.0)
MCHC: 31.8 g/dL (ref 30.0–36.0)
MCV: 92.9 fL (ref 80.0–100.0)
MCV: 93.4 fL (ref 80.0–100.0)
Platelets: 369 10*3/uL (ref 150–400)
Platelets: 320 K/uL (ref 150–400)
RBC: 4.2 MIL/uL — ABNORMAL LOW (ref 4.22–5.81)
RBC: 3.77 MIL/uL — ABNORMAL LOW (ref 4.22–5.81)
RDW: 15.5 % (ref 11.5–15.5)
RDW: 15.4 % (ref 11.5–15.5)
WBC: 11.8 10*3/uL — ABNORMAL HIGH (ref 4.0–10.5)
WBC: 8.6 K/uL (ref 4.0–10.5)
nRBC: 0 % (ref 0.0–0.2)
nRBC: 0 % (ref 0.0–0.2)

## 2018-05-28 LAB — URINE DRUG SCREEN, QUALITATIVE (ARMC ONLY)
AMPHETAMINES, UR SCREEN: NOT DETECTED
Barbiturates, Ur Screen: NOT DETECTED
Benzodiazepine, Ur Scrn: NOT DETECTED
Cannabinoid 50 Ng, Ur ~~LOC~~: POSITIVE — AB
Cocaine Metabolite,Ur ~~LOC~~: NOT DETECTED
MDMA (ECSTASY) UR SCREEN: NOT DETECTED
METHADONE SCREEN, URINE: NOT DETECTED
Opiate, Ur Screen: NOT DETECTED
Phencyclidine (PCP) Ur S: NOT DETECTED
Tricyclic, Ur Screen: NOT DETECTED

## 2018-05-28 SURGERY — REPAIR, PRIAPISM
Anesthesia: General

## 2018-05-28 MED ORDER — LACTATED RINGERS IV SOLN
INTRAVENOUS | Status: DC | PRN
  Administered 2018-05-28: 18:00:00 via INTRAVENOUS

## 2018-05-28 MED ORDER — HYDROMORPHONE HCL 1 MG/ML IJ SOLN
INTRAMUSCULAR | Status: AC
  Administered 2018-05-28: 0.5 mg via INTRAVENOUS
  Filled 2018-05-28: qty 1

## 2018-05-28 MED ORDER — HYDROMORPHONE HCL 1 MG/ML IJ SOLN
1.0000 mg | INTRAMUSCULAR | Status: DC | PRN
  Administered 2018-05-28 – 2018-05-29 (×4): 1 mg via INTRAVENOUS
  Filled 2018-05-28 (×4): qty 1

## 2018-05-28 MED ORDER — CEFAZOLIN SODIUM-DEXTROSE 2-3 GM-%(50ML) IV SOLR
INTRAVENOUS | Status: DC | PRN
  Administered 2018-05-28: 2 g via INTRAVENOUS

## 2018-05-28 MED ORDER — DEXAMETHASONE SODIUM PHOSPHATE 10 MG/ML IJ SOLN
INTRAMUSCULAR | Status: DC | PRN
  Administered 2018-05-28: 10 mg via INTRAVENOUS

## 2018-05-28 MED ORDER — ONDANSETRON HCL 4 MG/2ML IJ SOLN
4.0000 mg | INTRAMUSCULAR | Status: DC | PRN
  Administered 2018-05-28: 4 mg via INTRAVENOUS
  Filled 2018-05-28: qty 2

## 2018-05-28 MED ORDER — LISINOPRIL-HYDROCHLOROTHIAZIDE 10-12.5 MG PO TABS: 1.0000 | ORAL_TABLET | Freq: Every day | ORAL | Status: DC

## 2018-05-28 MED ORDER — BUPIVACAINE HCL (PF) 0.25 % IJ SOLN
INTRAMUSCULAR | Status: DC | PRN
  Administered 2018-05-28: 20 mL

## 2018-05-28 MED ORDER — DIPHENHYDRAMINE HCL 50 MG/ML IJ SOLN: 12.5000 mg | Freq: Four times a day (QID) | INTRAMUSCULAR | Status: DC | PRN

## 2018-05-28 MED ORDER — HYDROMORPHONE HCL 1 MG/ML IJ SOLN: 0.2500 mg | INTRAMUSCULAR | Status: DC | PRN

## 2018-05-28 MED ORDER — HYDROMORPHONE HCL 1 MG/ML IJ SOLN
INTRAMUSCULAR | Status: DC | PRN
  Administered 2018-05-28: 1 mg via INTRAVENOUS

## 2018-05-28 MED ORDER — MIDAZOLAM HCL 2 MG/2ML IJ SOLN
INTRAMUSCULAR | Status: AC
  Filled 2018-05-28: qty 2

## 2018-05-28 MED ORDER — HYDROCHLOROTHIAZIDE 12.5 MG PO CAPS
12.5000 mg | ORAL_CAPSULE | Freq: Every day | ORAL | Status: DC
  Administered 2018-05-29: 12.5 mg via ORAL
  Filled 2018-05-28: qty 1

## 2018-05-28 MED ORDER — DIPHENHYDRAMINE HCL 12.5 MG/5ML PO ELIX
12.5000 mg | ORAL_SOLUTION | Freq: Four times a day (QID) | ORAL | Status: DC | PRN
  Filled 2018-05-28: qty 10

## 2018-05-28 MED ORDER — HYDROMORPHONE HCL 1 MG/ML IJ SOLN
1.0000 mg | Freq: Once | INTRAMUSCULAR | Status: AC
  Administered 2018-05-28: 1 mg via INTRAVENOUS
  Filled 2018-05-28: qty 1

## 2018-05-28 MED ORDER — FENTANYL CITRATE (PF) 100 MCG/2ML IJ SOLN
50.0000 ug | Freq: Once | INTRAMUSCULAR | Status: AC
  Administered 2018-05-28: 50 ug via INTRAVENOUS

## 2018-05-28 MED ORDER — SODIUM CHLORIDE 0.9 % IV SOLN
INTRAVENOUS | Status: DC
  Administered 2018-05-28: 21:00:00 via INTRAVENOUS

## 2018-05-28 MED ORDER — HYDROMORPHONE HCL 1 MG/ML IJ SOLN
INTRAMUSCULAR | Status: AC
  Filled 2018-05-28: qty 1

## 2018-05-28 MED ORDER — MORPHINE SULFATE (PF) 4 MG/ML IV SOLN
4.0000 mg | Freq: Once | INTRAVENOUS | Status: AC
  Administered 2018-05-28: 4 mg via INTRAVENOUS
  Filled 2018-05-28: qty 1

## 2018-05-28 MED ORDER — FENTANYL CITRATE (PF) 250 MCG/5ML IJ SOLN
INTRAMUSCULAR | Status: AC
  Filled 2018-05-28: qty 5

## 2018-05-28 MED ORDER — PROPOFOL 10 MG/ML IV BOLUS
INTRAVENOUS | Status: DC | PRN
  Administered 2018-05-28: 200 mg via INTRAVENOUS

## 2018-05-28 MED ORDER — FENTANYL CITRATE (PF) 100 MCG/2ML IJ SOLN
INTRAMUSCULAR | Status: AC
  Filled 2018-05-28: qty 2

## 2018-05-28 MED ORDER — LISINOPRIL 10 MG PO TABS
10.0000 mg | ORAL_TABLET | Freq: Every day | ORAL | Status: DC
  Administered 2018-05-29: 10 mg via ORAL
  Filled 2018-05-28: qty 1

## 2018-05-28 MED ORDER — HYDROMORPHONE HCL 1 MG/ML IJ SOLN
INTRAMUSCULAR | Status: AC
  Administered 2018-05-28: 0.5 mg via INTRAVENOUS
  Filled 2018-05-28: qty 2

## 2018-05-28 MED ORDER — ONDANSETRON HCL 4 MG/2ML IJ SOLN
INTRAMUSCULAR | Status: DC | PRN
  Administered 2018-05-28: 4 mg via INTRAVENOUS

## 2018-05-28 MED ORDER — ACETAMINOPHEN 10 MG/ML IV SOLN
INTRAVENOUS | Status: DC | PRN
  Administered 2018-05-28: 1000 mg via INTRAVENOUS

## 2018-05-28 MED ORDER — FENTANYL CITRATE (PF) 100 MCG/2ML IJ SOLN
25.0000 ug | INTRAMUSCULAR | Status: DC | PRN
  Administered 2018-05-28: 25 ug via INTRAVENOUS
  Administered 2018-05-28 (×2): 50 ug via INTRAVENOUS
  Administered 2018-05-28: 25 ug via INTRAVENOUS

## 2018-05-28 MED ORDER — OXYCODONE-ACETAMINOPHEN 5-325 MG PO TABS
1.0000 | ORAL_TABLET | ORAL | Status: DC | PRN
  Administered 2018-05-29: 1 via ORAL
  Filled 2018-05-28: qty 1

## 2018-05-28 MED ORDER — LIDOCAINE HCL (CARDIAC) PF 100 MG/5ML IV SOSY
PREFILLED_SYRINGE | INTRAVENOUS | Status: DC | PRN
  Administered 2018-05-28: 100 mg via INTRAVENOUS

## 2018-05-28 MED ORDER — ONDANSETRON HCL 4 MG/2ML IJ SOLN: 4.0000 mg | Freq: Once | INTRAMUSCULAR | Status: DC | PRN

## 2018-05-28 MED ORDER — ZOLPIDEM TARTRATE 5 MG PO TABS: 5.0000 mg | ORAL_TABLET | Freq: Every evening | ORAL | Status: DC | PRN

## 2018-05-28 MED ORDER — MIDAZOLAM HCL 2 MG/2ML IJ SOLN
INTRAMUSCULAR | Status: DC | PRN
  Administered 2018-05-28 (×2): 2 mg via INTRAVENOUS

## 2018-05-28 MED ORDER — ACETAMINOPHEN 10 MG/ML IV SOLN
INTRAVENOUS | Status: AC
  Filled 2018-05-28: qty 100

## 2018-05-28 MED ORDER — FENTANYL CITRATE (PF) 100 MCG/2ML IJ SOLN
INTRAMUSCULAR | Status: DC | PRN
  Administered 2018-05-28: 100 ug via INTRAVENOUS
  Administered 2018-05-28 (×2): 50 ug via INTRAVENOUS
  Administered 2018-05-28: 100 ug via INTRAVENOUS
  Administered 2018-05-28: 50 ug via INTRAVENOUS

## 2018-05-28 MED ORDER — PROPOFOL 10 MG/ML IV BOLUS
INTRAVENOUS | Status: AC
  Filled 2018-05-28: qty 20

## 2018-05-28 MED ORDER — HYDROMORPHONE HCL 1 MG/ML IJ SOLN
0.2500 mg | INTRAMUSCULAR | Status: DC | PRN
  Administered 2018-05-28 (×6): 0.5 mg via INTRAVENOUS

## 2018-05-28 MED ORDER — FENTANYL CITRATE (PF) 100 MCG/2ML IJ SOLN
INTRAMUSCULAR | Status: AC
  Administered 2018-05-28: 50 ug via INTRAVENOUS
  Filled 2018-05-28: qty 2

## 2018-05-28 MED ORDER — BUPIVACAINE HCL (PF) 0.25 % IJ SOLN
INTRAMUSCULAR | Status: AC
  Filled 2018-05-28: qty 30

## 2018-05-28 SURGICAL SUPPLY — 28 items
BAG URINE DRAINAGE (UROLOGICAL SUPPLIES) ×2 IMPLANT
BLADE SURG 15 STRL LF DISP TIS (BLADE) ×1 IMPLANT
BLADE SURG 15 STRL SS (BLADE) ×3
BNDG CONFORM 2 STRL LF (GAUZE/BANDAGES/DRESSINGS) ×3 IMPLANT
CANISTER SUCT 1200ML W/VALVE (MISCELLANEOUS) ×3 IMPLANT
CATH COUDE FOLEY 2W 5CC 16FR (CATHETERS) ×2 IMPLANT
CHLORAPREP W/TINT 26ML (MISCELLANEOUS) ×3 IMPLANT
COVER WAND RF STERILE (DRAPES) ×3 IMPLANT
DRAPE LAPAROTOMY 77X122 PED (DRAPES) ×3 IMPLANT
ELECT CAUTERY NEEDLE TIP 1.0 (MISCELLANEOUS) ×3
ELECT REM PT RETURN 9FT ADLT (ELECTROSURGICAL) ×3
ELECTRODE CAUTERY NEDL TIP 1.0 (MISCELLANEOUS) ×1 IMPLANT
ELECTRODE REM PT RTRN 9FT ADLT (ELECTROSURGICAL) ×1 IMPLANT
GAUZE PETROLATUM 1 X8 (GAUZE/BANDAGES/DRESSINGS) ×3 IMPLANT
GLOVE BIO SURGEON STRL SZ 6.5 (GLOVE) ×2 IMPLANT
GLOVE BIO SURGEONS STRL SZ 6.5 (GLOVE) ×1
GOWN STRL REUS W/ TWL LRG LVL3 (GOWN DISPOSABLE) ×2 IMPLANT
GOWN STRL REUS W/TWL LRG LVL3 (GOWN DISPOSABLE) ×6
KIT TURNOVER KIT A (KITS) ×3 IMPLANT
LABEL OR SOLS (LABEL) ×3 IMPLANT
NDL HYPO 25X1 1.5 SAFETY (NEEDLE) ×1 IMPLANT
NEEDLE HYPO 25X1 1.5 SAFETY (NEEDLE) ×3 IMPLANT
NS IRRIG 500ML POUR BTL (IV SOLUTION) ×3 IMPLANT
PACK BASIN MINOR ARMC (MISCELLANEOUS) ×3 IMPLANT
SOL PREP PVP 2OZ (MISCELLANEOUS) ×3
SOLUTION PREP PVP 2OZ (MISCELLANEOUS) ×1 IMPLANT
SUT MNCRL AB 4-0 PS2 18 (SUTURE) ×9 IMPLANT
SYR 10ML LL (SYRINGE) ×3 IMPLANT

## 2018-05-28 NOTE — Anesthesia Preprocedure Evaluation (Addendum)
Anesthesia Evaluation  Patient identified by MRN, date of birth, ID band Patient awake    Reviewed: Allergy & Precautions, NPO status , Patient's Chart, lab work & pertinent test results, reviewed documented beta blocker date and time   Airway Mallampati: III  TM Distance: >3 FB     Dental  (+) Chipped   Pulmonary Current Smoker,           Cardiovascular hypertension, Pt. on medications      Neuro/Psych PSYCHIATRIC DISORDERS    GI/Hepatic   Endo/Other    Renal/GU      Musculoskeletal   Abdominal   Peds  Hematology   Anesthesia Other Findings Obese. ETOH. MJ. EKG shows early repol, no cardiac symptoms, will proceed.  Reproductive/Obstetrics                            Anesthesia Physical Anesthesia Plan  ASA: III  Anesthesia Plan: General   Post-op Pain Management:    Induction: Intravenous  PONV Risk Score and Plan:   Airway Management Planned: Oral ETT  Additional Equipment:   Intra-op Plan:   Post-operative Plan:   Informed Consent: I have reviewed the patients History and Physical, chart, labs and discussed the procedure including the risks, benefits and alternatives for the proposed anesthesia with the patient or authorized representative who has indicated his/her understanding and acceptance.     Plan Discussed with: CRNA  Anesthesia Plan Comments:         Anesthesia Quick Evaluation

## 2018-05-28 NOTE — Progress Notes (Signed)
Pt continues to have severe pain in penis    Hitting side rails and says he is in more pain now then ever     Dr Maisie Fus aware of blood pressure 191/121

## 2018-05-28 NOTE — ED Notes (Signed)
Pt transported to surgery

## 2018-05-28 NOTE — Anesthesia Post-op Follow-up Note (Signed)
Anesthesia QCDR form completed.        

## 2018-05-28 NOTE — Interval H&P Note (Signed)
History and Physical Interval Note:  05/28/2018 6:14 PM  Bill Hammond  has presented today for surgery, with the diagnosis of priapism  The various methods of treatment have been discussed with the patient and family. After consideration of risks, benefits and other options for treatment, the patient has consented to  Procedure(s): PRIAPISM REPAIR (N/A) as a surgical intervention .  The patient's history has been reviewed, patient examined, no change in status, stable for surgery.  I have reviewed the patient's chart and labs.  Questions were answered to the patient's satisfaction.     Wilkie Aye

## 2018-05-28 NOTE — Anesthesia Procedure Notes (Signed)
Procedure Name: LMA Insertion Date/Time: 05/28/2018 6:31 PM Performed by: Junious Silk, CRNA Pre-anesthesia Checklist: Patient identified, Patient being monitored, Timeout performed, Emergency Drugs available and Suction available Patient Re-evaluated:Patient Re-evaluated prior to induction Oxygen Delivery Method: Circle system utilized Preoxygenation: Pre-oxygenation with 100% oxygen Induction Type: IV induction Ventilation: Mask ventilation without difficulty LMA: LMA inserted LMA Size: 4.5 Tube type: Oral Number of attempts: 1 Placement Confirmation: positive ETCO2 and breath sounds checked- equal and bilateral Tube secured with: Tape Dental Injury: Teeth and Oropharynx as per pre-operative assessment

## 2018-05-28 NOTE — Transfer of Care (Signed)
Immediate Anesthesia Transfer of Care Note  Patient: Bill Hammond  Procedure(s) Performed: PENILE SHUNT (N/A )  Patient Location: PACU  Anesthesia Type:General  Level of Consciousness: awake, alert  and oriented  Airway & Oxygen Therapy: Patient Spontanous Breathing and Patient connected to face mask oxygen  Post-op Assessment: Report given to RN and Post -op Vital signs reviewed and stable  Post vital signs: Reviewed and stable  Last Vitals:  Vitals Value Taken Time  BP 198/142 05/28/2018  7:18 PM  Temp    Pulse 103 05/28/2018  7:20 PM  Resp 19 05/28/2018  7:20 PM  SpO2 98 % 05/28/2018  7:20 PM  Vitals shown include unvalidated device data.  Last Pain:  Vitals:   05/28/18 1658  TempSrc:   PainSc: (S) 0-No pain         Complications: No apparent anesthesia complications

## 2018-05-28 NOTE — Discharge Instructions (Signed)
Priapism Priapism is an unwanted erection of the penis that usually develops without sexual stimulation or desire. Priapism affects males of all ages. There are three types of priapism:  Recurrent acute priapism. With this type, erections are painful and last less than 3 hours. The erections come and go.  Acute prolonged priapism. With this type, erections are painful and last hours to days. This type can lead to erectile dysfunction.  Persistent priapism. With this type, erections are usually painless and can last weeks to years. The penis gets erect but not rigid. This type can lead to erectile dysfunction. What are the causes? This condition develops either when blood has difficulty leaving the penis (low-flow priapism) or if too much blood flows into the penis (high-flow priapism). Blood flow issues may be caused by:  Erectile dysfunction medicine. This is the most common cause.  Medicine that is used to treat depression and anxiety.  Blood problems that are common in people who have sickle cell disease or leukemia.  Use of illegal drugs, such as cocaine and marijuana.  Excessive alcohol use.  Neurological problems, such as multiple sclerosis.  Diabetes mellitus.  An injury to the penis.  An infection. In some cases, the cause may not be known. What are the signs or symptoms? Symptoms of this condition include:  A prolonged erection.  A painful erection. How is this diagnosed? This condition is diagnosed with a physical exam. Blood tests may be done to help identify the cause of the condition. How is this treated? Treatment for this condition depends on the cause and the type of priapism. Recurrent acute priapism is often managed at home. Acute prolonged priapism is usually treated at a hospital, where treatment may involve:  Getting fluid and medicines for pain through an IV tube.  A blood transfusion.  A procedure to drain blood from the penis.  Surgery to make a  passageway for blood to flow in the penis (surgical shunting). No standard treatment exists for persistent priapism. Follow these instructions at home: Managing Recurrent Priapism  Try taking a warm bath or exercising.  Keep track of how long your erection lasts. If it does not go away in 3 hours, seek medical care. General instructions  Avoid sexual stimulation and intercourse until your health care provider says that they are okay for you.  Avoid drugs or alcohol if they caused the priapism. Avoiding them can help to prevent the condition from coming back.  Drink enough fluid to keep your urine clear or pale yellow.  Empty your bladder as much as possible.  Take over-the-counter and prescription medicines only as told by your health care provider.  Do not take any medicines during an episode unless you get approval from your health care provider.  Keep all follow-up visits as told by your health care provider. This is important. Contact a health care provider if:  Your pain gets worse.  Your pain does not improve with treatment.  You have recurrent priapism and your erection does not go away in 3 hours. Get help right away if:  You have a fever or chills.  You have pain, swelling, or redness in your genitals or your groin area. This information is not intended to replace advice given to you by your health care provider. Make sure you discuss any questions you have with your health care provider. Document Released: 08/04/2003 Document Revised: 10/17/2015 Document Reviewed: 08/17/2014 Elsevier Interactive Patient Education  2019 [employer].

## 2018-05-28 NOTE — ED Notes (Signed)
Nurse spoke to pre-op nurse. Pt to go to surgery momentarily. Nurse confirmed that pt had all items off his person besides gown and hospital socks.

## 2018-05-28 NOTE — ED Notes (Signed)
Pt up and about in room. Pt in NAD, but asking for more pain medication.

## 2018-05-28 NOTE — ED Triage Notes (Signed)
Pt to ed with c/o penile erection since 6 am today.  Pt states he was recently here for the same thing.

## 2018-05-28 NOTE — ED Notes (Signed)
Pt is uncomfortable at this time. Nurse has medicated pt again for pain. Consent form is at bedside for surgery but not signed. Nurse has pt in gown and socks for procedure.

## 2018-05-28 NOTE — Op Note (Addendum)
Preoperative diagnosis: priapism  Postoperative diagnosis: Same  Procedure: 1. Distal corporal T shunt 2. Penile block   Attending: Wilkie Aye, MD  Anesthesia: general  estimated blood loss: 300cc  Antibiotics: ancef  Drains: 16 french foley  Specimens: none  Findings: firm erection prior to bilateral T shunt with bilateral corporal snaking. Dark blood drained from each corporal body at the beginning of the procedure and the output was then brighter after shunting. Penile block with 0.25% bupivicaine  Indications: Patient is a 43 year old male with a priapism that was refractory to phenylephrine therapy and Winters shunt.  After discussing treatment options he decided to proceed with distal corporal shunt with possible proximal corporal shunt   Procedure in detail: Prior to procedure consent was obtained.  Patient was brought to the operating room and a brief timeout was done to ensure correct patient, correct procedure, correct site.  General anesthesia was administered and patient was placed in supine position.  His genitalia and abdomen was then prepped and draped in usual sterile fashion.  Using a 10 blade we made an incision through the left side of the glans and into the left corpora. We then rotated the blade 90 degrees laterally and removed the blade. We then used an 8 french dilator and passed it through the left corporal body to the proximal end of the corporal body. We then removed the dialtorWe then closed the incision with 3-0 chromic in an interrupted fashion.  The priapism then returned and we elected to shunt the right corporal body. Using a 10 blade we made an incision through the right side of the glans and into the right corpora. We then rotated the blade 90 degrees laterally and removed the blade. We then used an 8 french dilator and passed it through the right corporal body to the proximal end of the corporal body. We then removed the dilator. We then closed the  incision with 3-0 chromic in an interrupted fashion. We then observed the the patient for 15 minutes and noted good detumescence. We then placed a 16 french foley. We then injected 20cc of 0.25% for a penile and ring block. Once this was complete we applied a gauze bandage and this then concluded the procedure which was well tolerated by the patient.  Complications: None  Condition: Stable, extubated, transferred to PACU.  Plan: Patient is to be admitted overnight for pain control. His foley is to be removed in the morning and he is to be discharged home. He will followup in 2 weeks for a wound check.

## 2018-05-28 NOTE — Progress Notes (Signed)
Continues to have pain  Has had a lot of pain medication   Visual pain 4 out of 10

## 2018-05-28 NOTE — H&P (Signed)
Urology Admission H&P  Chief Complaint: priapism  History of Present Illness: Mr Bill Hammond is a 43yo with a hx of stuttering priapism for the past 10 days he presented to the ER this morning with a painful erection since 6am. He has a hx of priapism fro the past 5-6 years and gets 1-2 events per year. He has had 4 events int he past 10 days.  Urine drug screen was positive for marijuana. On 12/28 had underwent a Winters shunt by Dr. Richardo HanksSninsky with detumescense. Currently the pain is constant, severe, and nonradiating. The pain is not relieved with narcotics. No other associated symptoms, no exacerbating/alleviaitng events. Patient was on oxygen which failed to improve his priapism. He had a screen for sickle cell in 2016 which was negative.   Past Medical History:  Diagnosis Date  . ETOH abuse   . Hypertension   . Priapism    History reviewed. No pertinent surgical history.  Home Medications:  No current facility-administered medications for this encounter.    Current Outpatient Medications  Medication Sig Dispense Refill Last Dose  . aspirin 325 MG tablet Take 325 mg by mouth daily as needed for mild pain.   Past Week at Unknown time  . ibuprofen (ADVIL,MOTRIN) 200 MG tablet Take 200 mg by mouth every 6 (six) hours as needed.   05/28/2018 at 1100  . lisinopril-hydrochlorothiazide (PRINZIDE,ZESTORETIC) 10-12.5 MG tablet Take 1 tablet by mouth daily. 30 tablet 1 05/28/2018 at 1100   Allergies: No Known Allergies  History reviewed. No pertinent family history. Social History:  reports that he has been smoking cigarettes. He has been smoking about 1.00 pack per day. He has never used smokeless tobacco. He reports current alcohol use. He reports that he does not use drugs.  Review of Systems  All other systems reviewed and are negative.   Physical Exam:  Vital signs in last 24 hours: Temp:  [98.2 F (36.8 C)] 98.2 F (36.8 C) (01/01 1257) Pulse Rate:  [88-118] 88 (01/01 1657) Resp:  [18-20]  18 (01/01 1657) BP: (128-157)/(70-98) 128/70 (01/01 1657) SpO2:  [97 %-99 %] 98 % (01/01 1657) Weight:  [126.6 kg] 126.6 kg (01/01 1257) Physical Exam  Constitutional: He is oriented to person, place, and time. He appears well-developed and well-nourished.  HENT:  Head: Normocephalic and atraumatic.  Eyes: Pupils are equal, round, and reactive to light. EOM are normal.  Neck: Normal range of motion. No thyromegaly present.  Cardiovascular: Normal rate and regular rhythm.  Respiratory: Effort normal. No respiratory distress.  GI: Soft. He exhibits no distension. Hernia confirmed negative in the right inguinal area and confirmed negative in the left inguinal area.  Genitourinary:    Testes normal.  Circumcised. Penile tenderness present.    Genitourinary Comments: Firm painful erection noted   Musculoskeletal: Normal range of motion.        General: No edema.  Lymphadenopathy:       Right: No inguinal adenopathy present.       Left: No inguinal adenopathy present.  Neurological: He is alert and oriented to person, place, and time.  Skin: Skin is warm and dry.  Psychiatric: He has a normal mood and affect. His behavior is normal. Judgment and thought content normal.    Laboratory Data:  Results for orders placed or performed during the hospital encounter of 05/28/18 (from the past 24 hour(s))  CBC     Status: Abnormal   Collection Time: 05/28/18  1:27 PM  Result Value Ref Range  WBC 11.8 (H) 4.0 - 10.5 K/uL   RBC 4.20 (L) 4.22 - 5.81 MIL/uL   Hemoglobin 12.5 (L) 13.0 - 17.0 g/dL   HCT 29.539.0 62.139.0 - 30.852.0 %   MCV 92.9 80.0 - 100.0 fL   MCH 29.8 26.0 - 34.0 pg   MCHC 32.1 30.0 - 36.0 g/dL   RDW 65.715.5 84.611.5 - 96.215.5 %   Platelets 369 150 - 400 K/uL   nRBC 0.0 0.0 - 0.2 %  Comprehensive metabolic panel     Status: Abnormal   Collection Time: 05/28/18  1:27 PM  Result Value Ref Range   Sodium 132 (L) 135 - 145 mmol/L   Potassium 3.7 3.5 - 5.1 mmol/L   Chloride 98 98 - 111 mmol/L    CO2 25 22 - 32 mmol/L   Glucose, Bld 106 (H) 70 - 99 mg/dL   BUN 11 6 - 20 mg/dL   Creatinine, Ser 9.520.89 0.61 - 1.24 mg/dL   Calcium 9.1 8.9 - 84.110.3 mg/dL   Total Protein 8.4 (H) 6.5 - 8.1 g/dL   Albumin 4.3 3.5 - 5.0 g/dL   AST 27 15 - 41 U/L   ALT 22 0 - 44 U/L   Alkaline Phosphatase 92 38 - 126 U/L   Total Bilirubin 0.7 0.3 - 1.2 mg/dL   GFR calc non Af Amer >60 >60 mL/min   GFR calc Af Amer >60 >60 mL/min   Anion gap 9 5 - 15  Urine Drug Screen, Qualitative ([facility] only)     Status: Abnormal   Collection Time: 05/28/18  1:45 PM  Result Value Ref Range   Tricyclic, Ur Screen NONE DETECTED NONE DETECTED   Amphetamines, Ur Screen NONE DETECTED NONE DETECTED   MDMA (Ecstasy)Ur Screen NONE DETECTED NONE DETECTED   Cocaine Metabolite,Ur [place] NONE DETECTED NONE DETECTED   Opiate, Ur Screen NONE DETECTED NONE DETECTED   Phencyclidine (PCP) Ur S NONE DETECTED NONE DETECTED   Cannabinoid 50 Ng, Ur [place] POSITIVE (A) NONE DETECTED   Barbiturates, Ur Screen NONE DETECTED NONE DETECTED   Benzodiazepine, Ur Scrn NONE DETECTED NONE DETECTED   Methadone Scn, Ur NONE DETECTED NONE DETECTED   No results found for this or any previous visit (from the past 240 hour(s)). Creatinine: Recent Labs    05/24/18 0551 05/28/18 1327  CREATININE 0.79 0.89   Baseline Creatinine: 0.8  Impression/Assessment:  42yo with priapism  Plan:  1. The risks/benefits/alternatives to distal corporal shunt with possible proximal corporal shunt was explained to the patient and he understands and wishes to proceed with surgery. We discussed the ED risk associated with both procedures  Bill Hammond 05/28/2018, 6:15 PM

## 2018-05-28 NOTE — ED Provider Notes (Signed)
[facility] Emergency Department Provider Note   ____________________________________________    I have reviewed the triage vital signs and the nursing notes.   HISTORY  Chief Complaint Priaprism     HPI Bill Hammond is a 43 y.o. male with a history of alcoholism and priapism presents today with priapism.  Patient reports symptoms started at 6 AM and have not abated.  He reports pain that is moderate to severe.  Denies drug abuse.  No new medications.  Reports this is his fourth episode of priapism in the last 2 weeks, recently had a Winter shunt placed 3 days ago for similar episode of priapism.  Has not taken anything for this.  Nothing seems to make it better.   Past Medical History:  Diagnosis Date  . ETOH abuse   . Hypertension   . Priapism     Patient Active Problem List   Diagnosis Date Noted  . Priapism, drug-induced   . Priapism     History reviewed. No pertinent surgical history.  Prior to Admission medications   Medication Sig Start Date End Date Taking? Authorizing Provider  aspirin 325 MG tablet Take 325 mg by mouth daily as needed for mild pain.    [provider]  HYDROcodone-acetaminophen (NORCO) 5-325 MG tablet Take 1 tablet by mouth every 6 (six) hours as needed for up to 7 doses for severe pain. 05/19/18   Merrily Brittle, MD  lisinopril-hydrochlorothiazide (PRINZIDE,ZESTORETIC) 10-12.5 MG tablet Take 1 tablet by mouth daily. 04/29/18   Triplett, Cari B, FNP  methylPREDNISolone (MEDROL) 4 MG tablet Take 1 tablet (4 mg total) by mouth daily. Take as directed Patient not taking: Reported on 05/19/2018 09/26/17   Evon Slack, PA-C     Allergies Patient has no known allergies.  History reviewed. No pertinent family history.  Social History Social History   Tobacco Use  . Smoking status: Current Every Day Smoker    Packs/day: 1.00    Types: Cigarettes  . Smokeless tobacco: Never Used  Substance Use  Topics  . Alcohol use: Yes    Comment: drinks 2 40 ounces a day  . 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  ENT: No sore throat. Cardiovascular: Denies chest pain. Respiratory: Denies shortness of breath. Gastrointestinal: No abdominal pain.  No nausea, no vomiting.   Genitourinary: As above Musculoskeletal: Negative for back pain. Skin: Negative for rash. Neurological: Negative for headaches or weakness   ____________________________________________   PHYSICAL EXAM:  VITAL SIGNS: ED Triage Vitals [05/28/18 1257]  Enc Vitals Group     BP (!) 155/98     Pulse Rate (!) 118     Resp 18     Temp 98.2 F (36.8 C)     Temp Source Oral     SpO2 99 %     Weight 126.6 kg (279 lb 1.6 oz)     Height      Head Circumference      Peak Flow      Pain Score 10     Pain Loc      Pain Edu?      Excl. in GC?     Constitutional: Alert and oriented. No acute distress.  Eyes: Conjunctivae are normal.   Nose: No congestion/rhinnorhea. Mouth/Throat: Mucous membranes are moist.    Cardiovascular: Normal rate, regular rhythm.  Grossly normal heart sounds.  Good peripheral circulation. Respiratory: Normal respiratory effort.  No retractions. Lungs CTAB. Gastrointestinal: Soft and nontender. No distention.   Genitourinary: Erect penis, no discoloration Musculoskeletal: No lower extremity tenderness nor edema.  Warm and well perfused Neurologic:  Normal speech and language. No gross focal neurologic deficits are appreciated.  Skin:  Skin is warm, dry and intact. No rash noted. Psychiatric: Mood and affect are normal. Speech and behavior are normal.  ____________________________________________   LABS (all labs ordered are listed, but only abnormal results are displayed)  Labs Reviewed  CBC - Abnormal; Notable for the following components:      Result Value   WBC 11.8 (*)    RBC  4.20 (*)    Hemoglobin 12.5 (*)    All other components within normal limits  COMPREHENSIVE METABOLIC PANEL - Abnormal; Notable for the following components:   Sodium 132 (*)    Glucose, Bld 106 (*)    Total Protein 8.4 (*)    All other components within normal limits  URINE DRUG SCREEN, QUALITATIVE ([facility] ONLY) - Abnormal; Notable for the following components:   Cannabinoid 50 Ng, Ur [place] POSITIVE (*)    All other components within normal limits   ____________________________________________  EKG  None ____________________________________________  RADIOLOGY  None ____________________________________________   PROCEDURES  Procedure(s) performed: No  Procedures   Critical Care performed: No ____________________________________________   INITIAL IMPRESSION / ASSESSMENT AND PLAN / ED COURSE  Pertinent labs & imaging results that were available during my care of the patient were reviewed by me and considered in my medical decision making (see chart for details).  Discussed with Dr. Thea Silversmith of urology, recommends attempting IV narcotics, O2 and checking CBC.  We will give IV Dilaudid   Significant improvement with pain medications.  Dr. Ronne Binning will take to the operating room    ____________________________________________   FINAL CLINICAL IMPRESSION(S) / ED DIAGNOSES  Final diagnoses:  Priapism        Note:  This document was prepared using Dragon voice recognition software and may include unintentional dictation errors.   Jene Every, MD 05/28/18 [phone]

## 2018-05-29 ENCOUNTER — Encounter: Payer: Self-pay | Admitting: Urology

## 2018-05-29 ENCOUNTER — Telehealth: Payer: Self-pay | Admitting: Urology

## 2018-05-29 LAB — CBC
HCT: 36.7 % — ABNORMAL LOW (ref 39.0–52.0)
Hemoglobin: 11.5 g/dL — ABNORMAL LOW (ref 13.0–17.0)
MCH: 29.3 pg (ref 26.0–34.0)
MCHC: 31.3 g/dL (ref 30.0–36.0)
MCV: 93.4 fL (ref 80.0–100.0)
Platelets: 342 10*3/uL (ref 150–400)
RBC: 3.93 MIL/uL — ABNORMAL LOW (ref 4.22–5.81)
RDW: 15 % (ref 11.5–15.5)
WBC: 11.1 10*3/uL — ABNORMAL HIGH (ref 4.0–10.5)
nRBC: 0 % (ref 0.0–0.2)

## 2018-05-29 MED ORDER — OXYCODONE-ACETAMINOPHEN 5-325 MG PO TABS: 1.0000 | ORAL_TABLET | ORAL | 0 refills | Status: AC | PRN

## 2018-05-29 MED ORDER — DIAZEPAM 2 MG PO TABS: 2.0000 mg | ORAL_TABLET | Freq: Every day | ORAL | 0 refills | Status: DC

## 2018-05-29 MED ORDER — DIAZEPAM 2 MG PO TABS
2.0000 mg | ORAL_TABLET | Freq: Once | ORAL | Status: AC
  Administered 2018-05-29: 2 mg via ORAL
  Filled 2018-05-29: qty 1

## 2018-05-29 NOTE — Telephone Encounter (Signed)
Referral was entered and sent over to [facility]

## 2018-05-29 NOTE — Discharge Planning (Addendum)
Patient dozed off in bed and awoke with spontaneous erection - this caused patient to urinate (50cc, no blood noted), then erection concluded.  Though educated that this appeared to be an expected/normal response d/t  his need for urination- patinet was still concerned about being discharged and having his same pre-admission s/sx resume.  Patient requesting 1x/dose of valium to address its effectiveness while still in hospital.  Dr. Luberta RobertsonPaged to inform, per patient's request.

## 2018-05-29 NOTE — Telephone Encounter (Signed)
-----   Message from Sondra Come, MD sent at 05/29/2018  8:27 AM EST ----- Regarding: follow up change Please set up follow up with Dr. Alvester Morin at [facility] in 2-4 weeks to discuss IPP and recurrent priapism.  Can cancel any follow up with me, thanks  Legrand Rams, MD 05/29/2018

## 2018-05-29 NOTE — Discharge Summary (Signed)
Physician Discharge Summary  Patient ID: Bill Hammond MRN: [id number] DOB/AGE: 07-10-1975 43 y.o.  Admit date: 05/28/2018 Discharge date: 05/29/2018  Admission Diagnoses:  Discharge Diagnoses:  Active Problems:   Priapism, drug-induced   Discharged Condition: good  Hospital Course:  Bill Hammond is a 43 year old African-American male with multiple episodes of recurrent priapism, including 4 in the last month.  He does use marijuana and drink alcohol, but he is unable to associate his episodes of priapism directly with his use of these substances.  He underwent a T shunt bilaterally in the operating room with Bill Hammond on 05/28/2018.  On postop day 1 his penis remained flaccid and pain was controlled.  He reportedly had a negative screen for sickle cell previously, however secondary to his concerning presentation hemoglobin electrophoresis was sent today.  He will follow-up with Bill Hammond at [facility] urology in [place] for discussion of possible inflatable penile prosthesis.  Treatments: T shunt for priapism  Discharge Exam: Blood pressure (!) 152/102, pulse 91, temperature (!) 97.5 F (36.4 C), resp. rate 20, weight 126.6 kg, SpO2 99 %.  Alert, no acute distress Abdomen soft nontender Flaccid phallus, no active bleeding  Disposition: Discharge disposition: 01-Home or Self Care       Discharge Instructions    Discharge instructions   Complete by:  As directed    Take off penile dressing in 48 hours. Take low dose valium nightly to prevent erections. Avoid alcohol and marijuana. Follow up with Bill Hammond in [place] in 2-3 weeks. Call the clinic if you have fever over 101.3.     Allergies as of 05/29/2018   No Known Allergies     Medication List    TAKE these medications   aspirin 325 MG tablet Take 325 mg by mouth daily as needed for mild pain.   diazepam 2 MG tablet Commonly known as:  VALIUM Take 1 tablet (2 mg total) by mouth at bedtime for 14 days.    ibuprofen 200 MG tablet Commonly known as:  ADVIL,MOTRIN Take 200 mg by mouth every 6 (six) hours as needed.   lisinopril-hydrochlorothiazide 10-12.5 MG tablet Commonly known as:  PRINZIDE,ZESTORETIC Take 1 tablet by mouth daily.   oxyCODONE-acetaminophen 5-325 MG tablet Commonly known as:  PERCOCET/ROXICET Take 1 tablet by mouth every 4 (four) hours as needed for up to 5 days for moderate pain.      Follow-up Information    [facility]. Call in 2 weeks.           Signed: Sondra Hammond 05/29/2018, 8:57 AM

## 2018-05-29 NOTE — Anesthesia Postprocedure Evaluation (Signed)
Anesthesia Post Note  Patient: Bill Hammond  Procedure(s) Performed: PENILE SHUNT (N/A )  Patient location during evaluation: PACU Anesthesia Type: General Level of consciousness: awake and alert Pain management: pain level controlled Vital Signs Assessment: post-procedure vital signs reviewed and stable Respiratory status: spontaneous breathing, nonlabored ventilation, respiratory function stable and patient connected to nasal cannula oxygen Cardiovascular status: blood pressure returned to baseline and stable Postop Assessment: no apparent nausea or vomiting Anesthetic complications: no     Last Vitals:  Vitals:   05/28/18 2038 05/28/18 2226  BP: (!) 192/125 (!) 149/107  Pulse: 92 87  Resp: 20   Temp: 36.7 C   SpO2: 95%     Last Pain:  Vitals:   05/29/18 0023  TempSrc:   PainSc: 7                  [name] S

## 2018-05-29 NOTE — Discharge Planning (Signed)
Patient IV removed.  RN assessment and VS revealed stability for DC to home.  Discharge papers given, explained and educated.  Informed of suggested FU appt and appt made.  Discussed s/sx of further infection and when to contact Dr. Colin Mulders x2 sent to [facility] ([address], [place]).  Was given "trial" dose of Valium few hrs ago, per patient request, and patient states "I'm  ok with DC (at this point)." Work note printed and given. Once ready will be wheeled to front. Waiting on ride to arrive.

## 2018-05-29 NOTE — Plan of Care (Signed)
  Problem: Pain Managment: Goal: General experience of comfort will improve Outcome: Progressing   

## 2018-05-30 LAB — HEMOGLOBINOPATHY EVALUATION
HGB A: 97.8 % (ref 96.4–98.8)
Hgb A2 Quant: 2.2 % (ref 1.8–3.2)
Hgb C: 0 %
Hgb F Quant: 0 % (ref 0.0–2.0)
Hgb S Quant: 0 %
Hgb Variant: 0 %

## 2018-06-05 ENCOUNTER — Emergency Department: Payer: Self-pay | Admitting: Anesthesiology

## 2018-06-05 ENCOUNTER — Observation Stay
Admission: EM | Admit: 2018-06-05 | Discharge: 2018-06-06 | Disposition: A | Discharge status: Home / Self Care | Payer: Self-pay | Attending: Urology | Admitting: Urology

## 2018-06-05 ENCOUNTER — Encounter
Admission: EM | Disposition: A | Discharge status: Home / Self Care | Payer: Self-pay | Source: Home / Self Care | Attending: Emergency Medicine

## 2018-06-05 DIAGNOSIS — N4830 Priapism, unspecified: Principal | ICD-10-CM | POA: Insufficient documentation

## 2018-06-05 DIAGNOSIS — N4839 Other priapism: Secondary | ICD-10-CM

## 2018-06-05 DIAGNOSIS — Z6837 Body mass index (BMI) 37.0-37.9, adult: Secondary | ICD-10-CM | POA: Insufficient documentation

## 2018-06-05 DIAGNOSIS — F17210 Nicotine dependence, cigarettes, uncomplicated: Secondary | ICD-10-CM | POA: Insufficient documentation

## 2018-06-05 DIAGNOSIS — E669 Obesity, unspecified: Secondary | ICD-10-CM | POA: Insufficient documentation

## 2018-06-05 DIAGNOSIS — I10 Essential (primary) hypertension: Secondary | ICD-10-CM | POA: Insufficient documentation

## 2018-06-05 DIAGNOSIS — Z7982 Long term (current) use of aspirin: Secondary | ICD-10-CM | POA: Insufficient documentation

## 2018-06-05 HISTORY — PX: PRIAPISM REPAIR: SHX6040

## 2018-06-05 SURGERY — REPAIR, PRIAPISM
Anesthesia: General | Site: Penis

## 2018-06-05 MED ORDER — FENTANYL CITRATE (PF) 100 MCG/2ML IJ SOLN
INTRAMUSCULAR | Status: AC
  Filled 2018-06-05: qty 2

## 2018-06-05 MED ORDER — HALOPERIDOL LACTATE 5 MG/ML IJ SOLN
5.0000 mg | Freq: Once | INTRAMUSCULAR | Status: AC
  Administered 2018-06-05: 5 mg via INTRAVENOUS
  Filled 2018-06-05: qty 1

## 2018-06-05 MED ORDER — ONDANSETRON HCL 4 MG/2ML IJ SOLN: 4.0000 mg | Freq: Once | INTRAMUSCULAR | Status: DC | PRN

## 2018-06-05 MED ORDER — FENTANYL CITRATE (PF) 100 MCG/2ML IJ SOLN
25.0000 ug | INTRAMUSCULAR | Status: AC | PRN
  Administered 2018-06-05 (×6): 25 ug via INTRAVENOUS

## 2018-06-05 MED ORDER — OXYCODONE HCL 5 MG PO TABS
5.0000 mg | ORAL_TABLET | Freq: Four times a day (QID) | ORAL | Status: DC | PRN
  Administered 2018-06-06 (×2): 5 mg via ORAL
  Filled 2018-06-05 (×2): qty 1

## 2018-06-05 MED ORDER — HYDROMORPHONE HCL 1 MG/ML IJ SOLN: 1.0000 mg | INTRAMUSCULAR | Status: DC | PRN

## 2018-06-05 MED ORDER — LISINOPRIL-HYDROCHLOROTHIAZIDE 10-12.5 MG PO TABS: 1.0000 | ORAL_TABLET | Freq: Every day | ORAL | Status: DC

## 2018-06-05 MED ORDER — FENTANYL CITRATE (PF) 100 MCG/2ML IJ SOLN
INTRAMUSCULAR | Status: DC | PRN
  Administered 2018-06-05: 100 ug via INTRAVENOUS
  Administered 2018-06-05: 50 ug via INTRAVENOUS
  Administered 2018-06-05: 100 ug via INTRAVENOUS

## 2018-06-05 MED ORDER — LISINOPRIL 10 MG PO TABS
10.0000 mg | ORAL_TABLET | Freq: Every day | ORAL | Status: DC
  Administered 2018-06-06: 10 mg via ORAL
  Filled 2018-06-05: qty 1

## 2018-06-05 MED ORDER — MORPHINE SULFATE (PF) 4 MG/ML IV SOLN
4.0000 mg | Freq: Once | INTRAVENOUS | Status: AC
  Administered 2018-06-05: 4 mg via INTRAVENOUS
  Filled 2018-06-05: qty 1

## 2018-06-05 MED ORDER — OXYCODONE-ACETAMINOPHEN 5-325 MG PO TABS
2.0000 | ORAL_TABLET | Freq: Once | ORAL | Status: AC
  Administered 2018-06-05: 2 via ORAL
  Filled 2018-06-05: qty 2

## 2018-06-05 MED ORDER — BELLADONNA ALKALOIDS-OPIUM 16.2-60 MG RE SUPP: 1.0000 | Freq: Four times a day (QID) | RECTAL | Status: DC | PRN

## 2018-06-05 MED ORDER — BUPIVACAINE HCL (PF) 0.25 % IJ SOLN
INTRAMUSCULAR | Status: DC | PRN
  Administered 2018-06-05: 26 mL

## 2018-06-05 MED ORDER — HYDROMORPHONE HCL 1 MG/ML IJ SOLN
1.0000 mg | Freq: Once | INTRAMUSCULAR | Status: AC
  Administered 2018-06-05: 1 mg via INTRAVENOUS
  Filled 2018-06-05: qty 1

## 2018-06-05 MED ORDER — DIPHENHYDRAMINE HCL 50 MG/ML IJ SOLN
25.0000 mg | Freq: Once | INTRAMUSCULAR | Status: AC
  Administered 2018-06-05: 25 mg via INTRAVENOUS
  Filled 2018-06-05: qty 1

## 2018-06-05 MED ORDER — LACTATED RINGERS IV SOLN
INTRAVENOUS | Status: DC | PRN
  Administered 2018-06-05: 18:00:00 via INTRAVENOUS

## 2018-06-05 MED ORDER — CEFAZOLIN SODIUM-DEXTROSE 2-4 GM/100ML-% IV SOLN
2.0000 g | Freq: Once | INTRAVENOUS | Status: AC
  Administered 2018-06-05: 2 g via INTRAVENOUS

## 2018-06-05 MED ORDER — SENNOSIDES-DOCUSATE SODIUM 8.6-50 MG PO TABS: 1.0000 | ORAL_TABLET | Freq: Every evening | ORAL | Status: DC | PRN

## 2018-06-05 MED ORDER — PROPOFOL 10 MG/ML IV BOLUS
INTRAVENOUS | Status: DC | PRN
  Administered 2018-06-05: 50 mg via INTRAVENOUS
  Administered 2018-06-05: 250 mg via INTRAVENOUS

## 2018-06-05 MED ORDER — SODIUM CHLORIDE 0.9 % IV SOLN: 250.0000 mL | INTRAVENOUS | Status: DC | PRN

## 2018-06-05 MED ORDER — ACETAMINOPHEN 325 MG PO TABS: 650.0000 mg | ORAL_TABLET | ORAL | Status: DC | PRN

## 2018-06-05 MED ORDER — ONDANSETRON HCL 4 MG/2ML IJ SOLN
INTRAMUSCULAR | Status: DC | PRN
  Administered 2018-06-05: 4 mg via INTRAVENOUS

## 2018-06-05 MED ORDER — PROPOFOL 500 MG/50ML IV EMUL
INTRAVENOUS | Status: AC
  Filled 2018-06-05: qty 50

## 2018-06-05 MED ORDER — FENTANYL CITRATE (PF) 250 MCG/5ML IJ SOLN
INTRAMUSCULAR | Status: AC
  Filled 2018-06-05: qty 5

## 2018-06-05 MED ORDER — MIDAZOLAM HCL 5 MG/5ML IJ SOLN
INTRAMUSCULAR | Status: AC
  Filled 2018-06-05: qty 5

## 2018-06-05 MED ORDER — HYDROMORPHONE HCL 1 MG/ML IJ SOLN
0.5000 mg | INTRAMUSCULAR | Status: DC | PRN
  Administered 2018-06-06: 0.5 mg via INTRAVENOUS
  Filled 2018-06-05 (×2): qty 0.5

## 2018-06-05 MED ORDER — HYDROCHLOROTHIAZIDE 12.5 MG PO CAPS
12.5000 mg | ORAL_CAPSULE | Freq: Every day | ORAL | Status: DC
  Administered 2018-06-06: 12.5 mg via ORAL
  Filled 2018-06-05: qty 1

## 2018-06-05 MED ORDER — MIDAZOLAM HCL 2 MG/2ML IJ SOLN
INTRAMUSCULAR | Status: DC | PRN
  Administered 2018-06-05: 5 mg via INTRAVENOUS

## 2018-06-05 MED ORDER — SODIUM CHLORIDE 0.9% FLUSH: 3.0000 mL | INTRAVENOUS | Status: DC | PRN

## 2018-06-05 MED ORDER — GLYCOPYRROLATE 0.2 MG/ML IJ SOLN
INTRAMUSCULAR | Status: DC | PRN
  Administered 2018-06-05: 0.2 mg via INTRAVENOUS

## 2018-06-05 MED ORDER — LIDOCAINE HCL (CARDIAC) PF 100 MG/5ML IV SOSY
PREFILLED_SYRINGE | INTRAVENOUS | Status: DC | PRN
  Administered 2018-06-05: 100 mg via INTRAVENOUS

## 2018-06-05 MED ORDER — SODIUM CHLORIDE 0.9% FLUSH
3.0000 mL | Freq: Two times a day (BID) | INTRAVENOUS | Status: DC
  Administered 2018-06-05: 3 mL via INTRAVENOUS

## 2018-06-05 MED ORDER — ONDANSETRON HCL 4 MG/2ML IJ SOLN
4.0000 mg | INTRAMUSCULAR | Status: DC | PRN
  Administered 2018-06-06: 4 mg via INTRAVENOUS
  Filled 2018-06-05: qty 2

## 2018-06-05 SURGICAL SUPPLY — 27 items
BLADE SURG 15 STRL LF DISP TIS (BLADE) ×1 IMPLANT
BLADE SURG 15 STRL SS (BLADE) ×3
BNDG COHESIVE 1X5 TAN STRL LF (GAUZE/BANDAGES/DRESSINGS) ×3 IMPLANT
BNDG CONFORM 2 STRL LF (GAUZE/BANDAGES/DRESSINGS) ×3 IMPLANT
CATH FOLEY 2WAY SLVR  5CC 18FR (CATHETERS) ×2
CATH FOLEY 2WAY SLVR 5CC 18FR (CATHETERS) ×1 IMPLANT
COVER WAND RF STERILE (DRAPES) IMPLANT
DRAPE LAPAROTOMY T 102X78X121 (DRAPES) ×3 IMPLANT
ELECT PENCIL ROCKER SW 15FT (MISCELLANEOUS) ×3 IMPLANT
ELECT REM PT RETURN 15FT ADLT (MISCELLANEOUS) ×3 IMPLANT
GAUZE 4X4 16PLY RFD (DISPOSABLE) ×3 IMPLANT
GAUZE PETROLATUM 1 X8 (GAUZE/BANDAGES/DRESSINGS) ×3 IMPLANT
GLOVE BIOGEL PI IND STRL 7.5 (GLOVE) ×1 IMPLANT
GLOVE BIOGEL PI INDICATOR 7.5 (GLOVE) ×2
GOWN STRL REUS W/TWL LRG LVL3 (GOWN DISPOSABLE) ×6 IMPLANT
KIT BASIN OR (CUSTOM PROCEDURE TRAY) ×3 IMPLANT
NEEDLE HYPO 22GX1.5 SAFETY (NEEDLE) IMPLANT
NS IRRIG 1000ML POUR BTL (IV SOLUTION) IMPLANT
PACK BASIC VI WITH GOWN DISP (CUSTOM PROCEDURE TRAY) ×3 IMPLANT
SLEEVE SCD COMPRESS THIGH MED (MISCELLANEOUS) ×2 IMPLANT
SUT CHROMIC 4 0 RB 1X27 (SUTURE) ×6 IMPLANT
SUT CHROMIC 5 0 P 3 (SUTURE) ×3 IMPLANT
SUT VIC AB 2-0 SH 27 (SUTURE) ×6
SUT VIC AB 2-0 SH 27X BRD (SUTURE) ×2 IMPLANT
SUT VIC AB 3-0 SH 27 (SUTURE) ×6
SUT VIC AB 3-0 SH 27XBRD (SUTURE) ×2 IMPLANT
SYR CONTROL 10ML LL (SYRINGE) IMPLANT

## 2018-06-05 NOTE — ED Notes (Addendum)
ENTERED ROOM AND PT IMMEDIATELY DEMANDED PAIN MEDICATION - he reports that he needs stronger doses of morphine, dilaudid and fentanyl Pt reports erection since 7am - he reports this has happened multiple times over the past month since surgery in December - he states that something "popped" while he was trying to urinate and this caused a mass around the head of penis and leaving him with an erection (he reports that he has not been able to void properly since catheter was placed last visit to ED

## 2018-06-05 NOTE — ED Notes (Signed)
Pt demanding a new urologist and a new ED provider - demanding/yelling/cursing to be put to sleep - Dr Derrill Kay notified and new orders received

## 2018-06-05 NOTE — ED Notes (Signed)
Urologist called and stated that [facility] and [facility] are on diversion - He reports that he is awaiting a call back from [facility] and if they will not accept pt that he will be going to the OR here tonight - pt to be kept NPO

## 2018-06-05 NOTE — Anesthesia Procedure Notes (Signed)
Procedure Name: LMA Insertion Date/Time: 06/05/2018 5:42 PM Performed by: Irving BurtonBachich, Teshara Moree, CRNA Pre-anesthesia Checklist: Patient identified, Emergency Drugs available, Suction available and Patient being monitored Patient Re-evaluated:Patient Re-evaluated prior to induction Oxygen Delivery Method: Circle system utilized Preoxygenation: Pre-oxygenation with 100% oxygen Induction Type: IV induction Ventilation: Mask ventilation without difficulty LMA: LMA inserted LMA Size: 4.5 Number of attempts: 1 Placement Confirmation: positive ETCO2 and breath sounds checked- equal and bilateral Tube secured with: Tape Dental Injury: Teeth and Oropharynx as per pre-operative assessment

## 2018-06-05 NOTE — ED Provider Notes (Signed)
[facility] Emergency Department Provider Note   ____________________________________________   I have reviewed the triage vital signs and the nursing notes.   HISTORY  Chief Complaint priapism   History limited by: Not Limited   HPI Bill Hammond is a 43 y.o. male who presents to the emergency department today because of concern for penile swelling and pain. The patient has a history of priapism and states that he underwent a shunt procedure a little over one week ago. He states that he had been doing fine until this morning. He felt a pop and then noticed swelling. He states that he also noticed some bleeding this morning. States that he is having severe pain to the penis. He denies any trauma.    Per medical record review patient has a history of priapism.   Past Medical History:  Diagnosis Date  . ETOH abuse   . Hypertension   . Priapism     Patient Active Problem List   Diagnosis Date Noted  . Priapism, drug-induced   . Priapism     Past Surgical History:  Procedure Laterality Date  . PRIAPISM REPAIR N/A 05/28/2018   Procedure: PENILE SHUNT;  Surgeon: Malen Gauze, MD;  Location: [facility] ORS;  Service: Urology;  Laterality: N/A;    Prior to Admission medications   Medication Sig Start Date End Date Taking? Authorizing Provider  aspirin 325 MG tablet Take 325 mg by mouth daily as needed for mild pain.    [provider]  diazepam (VALIUM) 2 MG tablet Take 1 tablet (2 mg total) by mouth at bedtime for 14 days. 05/29/18 06/12/18  Sondra Come, MD  ibuprofen (ADVIL,MOTRIN) 200 MG tablet Take 200 mg by mouth every 6 (six) hours as needed.    [provider]  lisinopril-hydrochlorothiazide (PRINZIDE,ZESTORETIC) 10-12.5 MG tablet Take 1 tablet by mouth daily. 04/29/18   Chinita Pester, FNP    Allergies Patient has no known allergies.  No family history on file.  Social History Social History   Tobacco Use  .  Smoking status: Current Every Day Smoker    Packs/day: 1.00    Types: Cigarettes  . Smokeless tobacco: Never Used  Substance Use Topics  . Alcohol use: Yes    Comment: drinks 2 40 ounces a day  . 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 ENT: No sore throat. Cardiovascular: Denies chest pain. Respiratory: Denies shortness of breath. Gastrointestinal: No abdominal pain.  No nausea, no vomiting.  No diarrhea.   Genitourinary: Positive for penile swelling, bleeding and pain. Musculoskeletal: Negative for back pain. Skin: Negative for rash. Neurological: Negative for headaches, focal weakness or numbness.  ____________________________________________   PHYSICAL EXAM:  VITAL SIGNS: ED Triage Vitals [06/05/18 1224]  Enc Vitals Group     BP (!) 201/112     Pulse Rate 98     Resp 18     Temp 97.8 F (36.6 C)     Temp Source Oral     SpO2 98 %     Weight 279 lb (126.6 kg)     Height 6' (1.829 m)     Head Circumference      Peak Flow      Pain Score 10   Constitutional: Alert and oriented.  Eyes: Conjunctivae are normal.  ENT      Head: Normocephalic and atraumatic.  Nose: No congestion/rhinnorhea.      Mouth/Throat: Mucous membranes are moist.      Neck: No stridor. Hematological/Lymphatic/Immunilogical: No cervical lymphadenopathy. Cardiovascular: Normal rate, regular rhythm.  No murmurs, rubs, or gallops.  Respiratory: Normal respiratory effort without tachypnea nor retractions. Breath sounds are clear and equal bilaterally. No wheezes/rales/rhonchi. Gastrointestinal: Soft and non tender. No rebound. No guarding.  Genitourinary: Swollen, tender. No active bleeding from surgical incision sites.  Musculoskeletal: Normal range of motion in all extremities. No lower extremity edema. Neurologic:  Normal speech and language. No gross focal neurologic deficits are  appreciated.  Skin:  Skin is warm, dry and intact. No rash noted. Psychiatric: Mood and affect are normal. Speech and behavior are normal. Patient exhibits appropriate insight and judgment.  ____________________________________________    LABS (pertinent positives/negatives)  None  ____________________________________________   EKG  None  ____________________________________________    RADIOLOGY  None  ____________________________________________   PROCEDURES  Procedures  ____________________________________________   INITIAL IMPRESSION / ASSESSMENT AND PLAN / ED COURSE  Pertinent labs & imaging results that were available during my care of the patient were reviewed by me and considered in my medical decision making (see chart for details).   Patient presented to the emergency department today because of concerns for priapism.  Patient recently had shunt placement.  On exam patient with no active bleeding.  Dr. Richardo HanksSninsky with urology did evaluate.  He did take patient to the OR.   ____________________________________________   FINAL CLINICAL IMPRESSION(S) / ED DIAGNOSES  Final diagnoses:  Priapism     Note: This dictation was prepared with Dragon dictation. Any transcriptional errors that result from this process are unintentional     Phineas SemenGoodman, Lujuana Kapler, MD 06/05/18 [phone]

## 2018-06-05 NOTE — Progress Notes (Signed)
Called by lab at 1840 that a vbg had been left for [place].  It had been put in the order as an ABG so we called and had the order changed to VBG.  The blood had not been placed on ice and was very watery on visualization.  The blood was run as a VBG but would not calculate any values do to it stating that the hemoglobin of the blood was less than 3.0  Dr Richardo Hanks informed of this.

## 2018-06-05 NOTE — Transfer of Care (Signed)
Immediate Anesthesia Transfer of Care Note  Patient: Bill Hammond  Procedure(s) Performed: DISTAL PENILE SHUNT (TSHUNT) (N/A Penis)  Patient Location: PACU  Anesthesia Type:General  Level of Consciousness: sedated  Airway & Oxygen Therapy: Patient connected to face mask oxygen  Post-op Assessment: Post -op Vital signs reviewed and stable  Post vital signs: stable  Last Vitals:  Vitals Value Taken Time  BP 110/55 06/05/2018  6:34 PM  Temp    Pulse 117 06/05/2018  6:34 PM  Resp 13 06/05/2018  6:34 PM  SpO2 100 % 06/05/2018  6:34 PM  Vitals shown include unvalidated device data.  Last Pain:  Vitals:   06/05/18 1452  TempSrc:   PainSc: 10-Worst pain ever         Complications: No apparent anesthesia complications

## 2018-06-05 NOTE — Anesthesia Post-op Follow-up Note (Signed)
Anesthesia QCDR form completed.        

## 2018-06-05 NOTE — ED Notes (Signed)
No new orders from MD Mayford Knife at this time.

## 2018-06-05 NOTE — ED Notes (Signed)
Pt very argumentative with nurse and yelling for me to give him "the milky medicine and knock my ass out" - he is complaining about the wait and demanding to be transferred now - explained to pt that [facility] was on diversion but that the ED doctor was attempting to find him a facility to be transferred to  Pt states that he cannot stand to hear the monitor and that he "WILL NOT" be wearing monitoring equipment

## 2018-06-05 NOTE — ED Notes (Signed)
First Nurse Note: Patient states he had surgery on 12/28 with shunt placed in penis, states that when he went to urinate this AM "something popped" and he has had priapism X 4 hrs.

## 2018-06-05 NOTE — Anesthesia Preprocedure Evaluation (Signed)
Anesthesia Evaluation  Patient identified by MRN, date of birth, ID band Patient awake    Reviewed: Allergy & Precautions, NPO status , Patient's Chart, lab work & pertinent test results, reviewed documented beta blocker date and time   Airway Mallampati: III  TM Distance: >3 FB     Dental  (+) Chipped   Pulmonary Current Smoker,           Cardiovascular hypertension, Pt. on medications      Neuro/Psych    GI/Hepatic   Endo/Other    Renal/GU      Musculoskeletal   Abdominal   Peds  Hematology   Anesthesia Other Findings ETOH. Obese.  Reproductive/Obstetrics                             Anesthesia Physical Anesthesia Plan  ASA: III  Anesthesia Plan: General   Post-op Pain Management:    Induction: Intravenous  PONV Risk Score and Plan:   Airway Management Planned: LMA  Additional Equipment:   Intra-op Plan:   Post-operative Plan:   Informed Consent: I have reviewed the patients History and Physical, chart, labs and discussed the procedure including the risks, benefits and alternatives for the proposed anesthesia with the patient or authorized representative who has indicated his/her understanding and acceptance.     Plan Discussed with: CRNA  Anesthesia Plan Comments:         Anesthesia Quick Evaluation

## 2018-06-05 NOTE — Anesthesia Postprocedure Evaluation (Signed)
Anesthesia Post Note  Patient: Bill Hammond  Procedure(s) Performed: DISTAL PENILE SHUNT (TSHUNT) (N/A Penis)  Patient location during evaluation: PACU Anesthesia Type: General Level of consciousness: awake and alert Pain management: pain level controlled Vital Signs Assessment: post-procedure vital signs reviewed and stable Respiratory status: spontaneous breathing, nonlabored ventilation, respiratory function stable and patient connected to nasal cannula oxygen Cardiovascular status: blood pressure returned to baseline and stable Postop Assessment: no apparent nausea or vomiting Anesthetic complications: no     Last Vitals:  Vitals:   06/05/18 2000 06/05/18 2012  BP: (!) 155/82 (!) 154/110  Pulse: 94 87  Resp: 11   Temp:  (!) 36.4 C  SpO2: 96% 100%    Last Pain:  Vitals:   06/05/18 2012  TempSrc: Oral  PainSc:                  Densel Kronick S

## 2018-06-05 NOTE — Op Note (Addendum)
Date of procedure: 06/05/18  Preoperative diagnosis:  1. Recurrent priapism  Postoperative diagnosis:  1. Same   Procedure: 1. Distal bilateral penile T-shunt  Surgeon: Legrand Rams, MD  Anesthesia: General  Complications: None  Intraoperative findings:  1. Priapism   EBL: 300cc  Specimens:  Corporal blood for blood gas  Drains: 35F foley  Indication: Bill Hammond is a 43 y.o. patient with recurrent priapism despite distal Winter shunt and T- shunt in the last 2 weeks.  I discussed his case with the on-call urologist at [facility] who did not recommend a proximal shunt, but repeating a distal shunt or proceeding with penile prosthesis.  The patient was not comfortable proceeding with penile prosthesis at this time, and informed consent was obtained for a distal T shunt and corporal dilation. We have discussed the potential benefits and risks of the procedure, side effects of the proposed treatment, the likelihood of the patient achieving the goals of the procedure, and any potential problems that might occur during the procedure or recuperation. Informed consent has been obtained.  We specifically discussed the risks of bleeding, infection, recurrent priapism, long-term erectile dysfunction, and potentially ultimate need for penile prosthesis.  Description of procedure:  The patient was taken to the operating room and general anesthesia was induced.  The patient was placed in supine position, prepped and draped in the usual sterile fashion, and preoperative antibiotics(cefazolin) were administered. A preoperative time-out was performed.   A 16 French Foley passed easily into the bladder with return of clear yellow urine.  10 cc were placed in the balloon.  When the bladder was drained and the catheter was capped.  A penile block and ring block were performed using a total of 27cc of bupivacaine without epinephrine.  Arterial blood gas was sent by passing the ABG needle into the  right corpora via the glans parallel to the urethra.  Blood was dark maroon and appeared consistent with ischemic priapism.  This was sent for analysis.  The prior sutures in the glans were removed.  We started on the right side by using an 11 blade scalpel passed parallel to the urethra from the glans into the right corpora.  This was rotated laterally to widen the shunt.  An 8 mm Hegar dilator was then passed into the corpora proximally to the pubic bone.  Dark maroon blood was expressed from the corpora.  The penis detumesced, however there was still some slight rigidity.  In the setting of his complex history and recurrent priapism I elected to perform the exact same procedure on the left side.  The 11 blade was inserted again into the left corpora through the glans, and rotated laterally.  The hegar dilator was again used to dilate the corporal tissue proximally to the pubic bone and dark maroon blood expressed.  When the penis was completely flaccid and the blood appeared bright red and arterial, a figure-of-eight stitch using a 3-0 chromic was used to close the glans incisions bilaterally.  There was excellent hemostasis and the penis remained detumesced.  The catheter was connected to dependent drainage, and the penis was gently wrapped in gauze and Coban.  Disposition: Stable to PACU  Plan: Admit overnight for pain control and observation Remove Foley tomorrow morning  Legrand Rams, MD

## 2018-06-05 NOTE — ED Triage Notes (Signed)
Pt came to Ed via pov w/ penile erection for past 4.5hrs. Recent admission for priapism. Pain 10/10, bp elevated.

## 2018-06-05 NOTE — H&P (Addendum)
5:08 PM   EFRAIM VANALLEN 03/31/1976 [id number]  CC: Recurrent priapism  HPI: I saw Mr. Donahoe in the emergency department for recurrent priapism.  He is a 43 year old African-American male well-known to our urology service.  He reports he has 1-2 episodes of ischemic priapism per year requiring aspiration and irrigation.  He had 2 episodes on 12/21 and 12/23 that were managed by the emergency department with injection of phenylephrine.  I first met him on 12/28 when he presented with priapism over 10 hours.  At that point, under moderate sedation in the ED, I performed a distal shunt using an 18-gauge biopsy needle in the right corpora.  His priapism resolved and he was discharged home.  He then re-presented on 05/28/2018 with recurrent priapism and underwent bilateral distal T shunt in the operating room with Dr. Alyson Ingles.  He does use alcohol and smoke marijuana.  Denies cocaine or other illicit drugs.  He presents today with priapism since 7 AM this morning.  He reports when he voided this morning he heard "a pop" and his erection never went down.  He denies any recent marijuana use, but had multiple beers last night.  He reports 10 out of 10 penile pain.  He also reports he has had difficulty with urination since his surgery 1/1 when a catheter was placed.  He had hemoglobin electrophoresis on 05/28/2018 which showed no evidence of sickle cell disease.   PMH: Past Medical History:  Diagnosis Date  . ETOH abuse   . Hypertension   . Priapism     Surgical History: Past Surgical History:  Procedure Laterality Date  . PRIAPISM REPAIR N/A 05/28/2018   Procedure: PENILE SHUNT;  Surgeon: Cleon Gustin, MD;  Location: [facility] ORS;  Service: Urology;  Laterality: N/A;    Allergies: No Known Allergies  Family History: No family history on file.  Social History:  reports that he has been smoking cigarettes. He has been smoking about 1.00 pack per day. He has never used smokeless tobacco.  He reports current alcohol use. He reports that he does not use drugs.  Physical Exam: BP (!) 198/122   Pulse 88   Temp 97.8 F (36.6 C) (Oral)   Resp 18   Ht 6' (1.829 m)   Wt 126.6 kg   SpO2 100%   BMI 37.84 kg/m    Constitutional: Alert, appears uncomfortable Cardiovascular: Regular rate and rhythm Respiratory: Clear to auscultation bilaterally GI: Abdomen is soft, nontender, nondistended, no abdominal masses GU: Erect circumcised phallus, extremely tender.  Mild circumferential edema at the corona, scarring at glans bilaterally from prior incision sites Lymph: No cervical or inguinal lymphadenopathy. Skin: No rashes, bruises or suspicious lesions. Neurologic: Grossly intact, no focal deficits, moving all 4 extremities. Psychiatric: Normal mood and affect.  Laboratory Data: None to review  Pertinent Imaging: None to review  Assessment & Plan:   In summary, the patient is a 43 year old African-American male without sickle cell disease and recurrent episodes of priapism despite 2 distal shunts in the last 12 days.  I discussed his case with the on-call urologist at [facility] and [place].  Both hospitals were full and they were unable to accept the patient in transfer.  They both recommended proceeding with repeat distal shunt or prosthesis.  I had previously  referred the patient to Dr. Gloriann Loan in [place] for penile prosthesis, however he had not yet followed up.  He is currently unsure if he would like to proceed with prosthesis at this  time.  We discussed his unique case and the fact that he has had recurrent priapism despite our best efforts for shunting.  We will plan for repeat distal shunt in the OR tonight.  We discussed the risks and benefits at length including bleeding, infection, recurrent priapism, or erectile dysfunction, fibrosis, and likely need long-term for prosthesis. He understands these risks and elects to proceed.  OR tonight for distal T shunt and dilation  Billey Co, MD  [facility] [address] [place], [place] [address] [phone]

## 2018-06-06 ENCOUNTER — Encounter: Payer: Self-pay | Admitting: Urology

## 2018-06-06 ENCOUNTER — Other Ambulatory Visit: Payer: Self-pay

## 2018-06-06 MED ORDER — HYDROCODONE-ACETAMINOPHEN 5-325 MG PO TABS: 1.0000 | ORAL_TABLET | ORAL | 0 refills | Status: AC | PRN

## 2018-06-06 NOTE — Progress Notes (Signed)
Discharge teaching given to patient, patient verbalized understanding and had no questions. Patient IV removed. Patient will be transported home by Benedetto Goad, he refused to be transported down by staff because he wanted to go and smoke a cigarette prior to discharge. All patient belongings gathered prior to leaving. Patient walked himself out at discharge.

## 2018-06-06 NOTE — Care Management Note (Signed)
Case Management Note  Patient Details  Name: Bill Hammond MRN: [id number] Date of Birth: [birth year]   Patient to discharge today.  Patient states that he currently does not have insurance, but he should have insurance through his employer soon.  Patient states that he utilizes [employer] for transportation.  Patient fills his prescriptions at [facility]. Currently only rx for discharge is for pain medication.  RNCM request that bedside RN notify me if there are any additional medications at discharge.  Patient provided application to  [facility] .  Patient request new patient appointment at [facility].  Appointment has been made at [facility] on 2/27.  Appointment was added to AVS.   Subjective/Objective:                    Action/Plan:   Expected Discharge Date:  06/06/18               Expected Discharge Plan:  Home/Self Care  In-House Referral:     Discharge planning Services  CM Consult, Indigent Health Clinic, Medication Assistance  Post Acute Care Choice:    Choice offered to:     DME Arranged:    DME Agency:     HH Arranged:    HH Agency:     Status of Service:  Completed, signed off  If discussed at [employer], dates discussed:    Additional Comments:  Chapman Fitch, RN 06/06/2018, 10:11 AM

## 2018-06-06 NOTE — Discharge Summary (Signed)
Physician Discharge Summary  Patient ID: Bill Hammond MRN: [id number] DOB/AGE: October 31, 1975 43 y.o.  Admit date: 06/05/2018 Discharge date: 06/06/2018  Admission Diagnoses: Priapism  Discharge Diagnoses:  Active Problems:   Priapism   Discharged Condition: good  Hospital Course:  Patient is a 43 year old African-American male with recent negative work-up for sickle cell disease who is had numerous episodes of priapism over the last month.  He is undergone aspiration and phenylephrine injection, Winter shunt under moderate sedation in the ED, and T shunt with corporal dilation in the OR.  He was admitted with 12 hours of priapism, and underwent repeat bilateral distal T shunt with corporal dilation with resolution of his priapism.  Foley was removed and he was able to void prior to discharge.  He has follow-up with Dr. Alvester Morin at [facility] urology to discuss penile prosthesis.  I counseled him at length to avoid alcohol, drugs, and marijuana in the setting of his recurrent priapism.  Consults: None  Significant Diagnostic Studies: None  Treatments: OR for distal bilateral T shunt, corporal dilation  Discharge Exam: Blood pressure (!) 155/99, pulse 96, temperature 98.7 F (37.1 C), temperature source Oral, resp. rate (!) 22, height 6' (1.829 m), weight 131.5 kg, SpO2 100 %.  Alert, NAD Abdomen soft, NT Flaccid phallus with sutures at glans bilaterally  Disposition: Discharge disposition: 01-Home or Self Care       Discharge Instructions    Discharge instructions   Complete by:  As directed    You underwent surgery for priapism. Do not use alcohol, drugs, or marijuana. No activity or diet restrictions.  Keep scheduled follow up with Dr. Alvester Morin in [place].  [facility] [address] [place], [place] [address] [phone]     Allergies as of 06/06/2018   No Known Allergies     Medication List    STOP taking these medications    aspirin 325 MG tablet   diazepam 2 MG tablet Commonly known as:  VALIUM     TAKE these medications   HYDROcodone-acetaminophen 5-325 MG tablet Commonly known as:  NORCO/VICODIN Take 1 tablet by mouth every 4 (four) hours as needed for up to 5 days for moderate pain.   ibuprofen 200 MG tablet Commonly known as:  ADVIL,MOTRIN Take 200 mg by mouth every 6 (six) hours as needed.   lisinopril-hydrochlorothiazide 10-12.5 MG tablet Commonly known as:  PRINZIDE,ZESTORETIC Take 1 tablet by mouth daily.      Follow-up Information    Center, [facility]. Go on 07/24/2018.   Specialty:  General Practice Why:  Appt @ 11am, Patient needs to arrive @10 :45 with picture I.D., proof of insurance or proof of income, and proof of address. Contact information: [address] [place] [address] [phone]           Signed: Sondra Come 06/06/2018, 3:40 PM

## 2018-06-06 NOTE — Discharge Instructions (Signed)
Priapism °Priapism is an unwanted erection of the penis that usually develops without sexual stimulation or desire. Priapism affects males of all ages. There are three types of priapism: °· Recurrent acute priapism. With this type, erections are painful and last less than 3 hours. The erections come and go. °· Acute prolonged priapism. With this type, erections are painful and last hours to days. This type can lead to erectile dysfunction. °· Persistent priapism. With this type, erections are usually painless and can last weeks to years. The penis gets erect but not rigid. This type can lead to erectile dysfunction. °What are the causes? °This condition develops either when blood has difficulty leaving the penis (low-flow priapism) or if too much blood flows into the penis (high-flow priapism). Blood flow issues may be caused by: °· Erectile dysfunction medicine. This is the most common cause. °· Medicine that is used to treat depression and anxiety. °· Blood problems that are common in people who have sickle cell disease or leukemia. °· Use of illegal drugs, such as cocaine and marijuana. °· Excessive alcohol use. °· Neurological problems, such as multiple sclerosis. °· Diabetes mellitus. °· An injury to the penis. °· An infection. °In some cases, the cause may not be known. °What are the signs or symptoms? °Symptoms of this condition include: °· A prolonged erection. °· A painful erection. °How is this diagnosed? °This condition is diagnosed with a physical exam. Blood tests may be done to help identify the cause of the condition. °How is this treated? °Treatment for this condition depends on the cause and the type of priapism. Recurrent acute priapism is often managed at home. Acute prolonged priapism is usually treated at a hospital, where treatment may involve: °· Getting fluid and medicines for pain through an IV tube. °· A blood transfusion. °· A procedure to drain blood from the penis. °· Surgery to make a  passageway for blood to flow in the penis (surgical shunting). °No standard treatment exists for persistent priapism. °Follow these instructions at home: °Managing Recurrent Priapism °· Try taking a warm bath or exercising. °· Keep track of how long your erection lasts. If it does not go away in 3 hours, seek medical care. °General instructions °· Avoid sexual stimulation and intercourse until your health care provider says that they are okay for you. °· Avoid drugs or alcohol if they caused the priapism. Avoiding them can help to prevent the condition from coming back. °· Drink enough fluid to keep your urine clear or pale yellow. °· Empty your bladder as much as possible. °· Take over-the-counter and prescription medicines only as told by your health care provider. °· Do not take any medicines during an episode unless you get approval from your health care provider. °· Keep all follow-up visits as told by your health care provider. This is important. °Contact a health care provider if: °· Your pain gets worse. °· Your pain does not improve with treatment. °· You have recurrent priapism and your erection does not go away in 3 hours. °Get help right away if: °· You have a fever or chills. °· You have pain, swelling, or redness in your genitals or your groin area. °This information is not intended to replace advice given to you by your health care provider. Make sure you discuss any questions you have with your health care provider. °Document Released: 08/04/2003 Document Revised: 10/17/2015 Document Reviewed: 08/17/2014 °Elsevier Interactive Patient Education © 2019 Elsevier Inc. ° °

## 2018-06-17 ENCOUNTER — Ambulatory Visit: Payer: Self-pay | Admitting: Urology

## 2018-12-31 ENCOUNTER — Telehealth: Payer: Self-pay | Admitting: Family

## 2018-12-31 DIAGNOSIS — Z20822 Contact with and (suspected) exposure to covid-19: Secondary | ICD-10-CM

## 2018-12-31 MED ORDER — ALBUTEROL SULFATE HFA 108 (90 BASE) MCG/ACT IN AERS: 2.0000 | INHALATION_SPRAY | Freq: Four times a day (QID) | RESPIRATORY_TRACT | 0 refills | Status: DC | PRN

## 2018-12-31 NOTE — Progress Notes (Signed)
E-Visit for Corona Virus Screening   Your current symptoms could be consistent with the coronavirus.  Many health care providers can now test patients at their office but not all are.  [place] has multiple testing sites. For information on our COVID testing locations and hours go to [email or web address]  Please quarantine yourself while awaiting your test results.  We are enrolling you in our [place] for [place] . Daily you will receive a questionnaire within the [place] website. Our COVID 19 response team willl be monitoriing your responses daily.  Approximately 5 minutes was spent documenting and reviewing patient's chart.   You can go to one of the  testing sites listed below, while they are opened (see hours). You do need to self isolate until your results return and if positive 14 days from when your symptoms started and until you are 3 days symptom free.   Testing Locations (Monday - Friday, 8 a.m. - 3:30 p.m.) . [place]: [facility] at [facility], [address], [place], [place]: [place], [place], [place], [place] (entrance off [address])  . [place] [address] [place], [place] (across from [facility] Emergency Department)   COVID-19 is a respiratory illness with symptoms that are similar to the flu. Symptoms are typically mild to moderate, but there have been cases of severe illness and death due to the virus. The following symptoms may appear 2-14 days after exposure: . Fever . Cough . Shortness of breath or difficulty breathing . Chills . Repeated shaking with chills . Muscle pain . Headache . Sore throat . New loss of taste or smell . Fatigue . Congestion or runny nose . Nausea or vomiting . Diarrhea  It is vitally important that if you feel that you have an infection such as this virus or any other virus that you stay home and away from  places where you may spread it to others.  You should self-quarantine for 14 days if you have symptoms that could potentially be coronavirus or have been in close contact a with a person diagnosed with COVID-19 within the last 2 weeks. You should avoid contact with people age 8 and older.   You should wear a mask or cloth face covering over your nose and mouth if you must be around other people or animals, including pets (even at home). Try to stay at least 6 feet away from other people. This will protect the people around you.  You can use medication such as A prescription inhaler called Albuterol MDI 90 mcg /actuation 2 puffs every 4 hours as needed for shortness of breath, wheezing, cough  You may also take acetaminophen (Tylenol) as needed for fever.   Reduce your risk of any infection by using the same precautions used for avoiding the common cold or flu:  Marland Kitchen Wash your hands often with soap and warm water for at least 20 seconds.  If soap and water are not readily available, use an alcohol-based hand sanitizer with at least 60% alcohol.  . If coughing or sneezing, cover your mouth and nose by coughing or sneezing into the elbow areas of your shirt or coat, into a tissue or into your sleeve (not your hands). . Avoid shaking hands with others and consider head nods or verbal greetings only. . Avoid touching your eyes, nose, or mouth with unwashed hands.  . Avoid close contact with people who are sick. . Avoid  places or events with large numbers of people in one location, like concerts or sporting events. . Carefully consider travel plans you have or are making. . If you are planning any travel outside or inside the [place], visit the CDC's Travelers' Health webpage for the latest health notices. . If you have some symptoms but not all symptoms, continue to monitor at home and seek medical attention if your symptoms worsen. . If you are having a medical emergency, call 911.  HOME CARE . Only take  medications as instructed by your medical team. . Drink plenty of fluids and get plenty of rest. . A steam or ultrasonic humidifier can help if you have congestion.   GET HELP RIGHT AWAY IF YOU HAVE EMERGENCY WARNING SIGNS** FOR COVID-19. If you or someone is showing any of these signs seek emergency medical care immediately. Call 911 or proceed to your closest emergency facility if: . You develop worsening high fever. . Trouble breathing . Bluish lips or face . Persistent pain or pressure in the chest . New confusion . Inability to wake or stay awake . You cough up blood. . Your symptoms become more severe  **This list is not all possible symptoms. Contact your medical provider for any symptoms that are sever or concerning to you.   MAKE SURE YOU   Understand these instructions.  Will watch your condition.  Will get help right away if you are not doing well or get worse.  Your e-visit answers were reviewed by a board certified advanced clinical practitioner to complete your personal care plan.  Depending on the condition, your plan could have included both over the counter or prescription medications.  If there is a problem please reply once you have received a response from your provider.  Your safety is important to us.  If you have drug allergies check your prescription carefully.    You can use MyChart to ask questions about today's visit, request a non-urgent call back, or ask for a work or school excuse for 24 hours related to this e-Visit. If it has been greater than 24 hours you will need to follow up with your provider, or enter a new e-Visit to address those concerns. You will get an e-mail in the next two days asking about your experience.  I hope that your e-visit has been valuable and will speed your recovery. Thank you for using e-visits.    

## 2019-01-01 ENCOUNTER — Encounter (INDEPENDENT_AMBULATORY_CARE_PROVIDER_SITE_OTHER): Payer: Self-pay

## 2019-01-02 ENCOUNTER — Encounter (INDEPENDENT_AMBULATORY_CARE_PROVIDER_SITE_OTHER): Payer: Self-pay

## 2019-01-03 ENCOUNTER — Encounter (INDEPENDENT_AMBULATORY_CARE_PROVIDER_SITE_OTHER): Payer: Self-pay

## 2019-01-04 ENCOUNTER — Encounter (INDEPENDENT_AMBULATORY_CARE_PROVIDER_SITE_OTHER): Payer: Self-pay

## 2019-01-05 ENCOUNTER — Encounter (INDEPENDENT_AMBULATORY_CARE_PROVIDER_SITE_OTHER): Payer: Self-pay

## 2019-01-06 ENCOUNTER — Encounter (INDEPENDENT_AMBULATORY_CARE_PROVIDER_SITE_OTHER): Payer: Self-pay

## 2019-01-07 ENCOUNTER — Encounter (INDEPENDENT_AMBULATORY_CARE_PROVIDER_SITE_OTHER): Payer: Self-pay

## 2019-01-09 ENCOUNTER — Encounter (INDEPENDENT_AMBULATORY_CARE_PROVIDER_SITE_OTHER): Payer: Self-pay

## 2019-01-12 ENCOUNTER — Encounter (INDEPENDENT_AMBULATORY_CARE_PROVIDER_SITE_OTHER): Payer: Self-pay

## 2019-01-13 ENCOUNTER — Encounter (INDEPENDENT_AMBULATORY_CARE_PROVIDER_SITE_OTHER): Payer: Self-pay

## 2019-03-20 ENCOUNTER — Emergency Department (HOSPITAL_COMMUNITY)
Admission: EM | Admit: 2019-03-20 | Discharge: 2019-03-21 | Payer: Self-pay | Attending: Emergency Medicine | Admitting: Emergency Medicine

## 2019-03-20 ENCOUNTER — Encounter (HOSPITAL_COMMUNITY): Payer: Self-pay

## 2019-03-20 ENCOUNTER — Other Ambulatory Visit: Payer: Self-pay

## 2019-03-20 DIAGNOSIS — I10 Essential (primary) hypertension: Secondary | ICD-10-CM | POA: Insufficient documentation

## 2019-03-20 DIAGNOSIS — Z9114 Patient's other noncompliance with medication regimen: Secondary | ICD-10-CM

## 2019-03-20 DIAGNOSIS — Z79899 Other long term (current) drug therapy: Secondary | ICD-10-CM | POA: Insufficient documentation

## 2019-03-20 DIAGNOSIS — F17210 Nicotine dependence, cigarettes, uncomplicated: Secondary | ICD-10-CM | POA: Insufficient documentation

## 2019-03-20 MED ORDER — IBUPROFEN 200 MG PO TABS
400.0000 mg | ORAL_TABLET | Freq: Once | ORAL | Status: AC
  Administered 2019-03-20: 400 mg via ORAL
  Filled 2019-03-20: qty 2

## 2019-03-20 MED ORDER — AMLODIPINE BESYLATE 5 MG PO TABS
10.0000 mg | ORAL_TABLET | Freq: Once | ORAL | Status: AC
  Administered 2019-03-20: 10 mg via ORAL
  Filled 2019-03-20: qty 2

## 2019-03-20 NOTE — ED Triage Notes (Signed)
Pt coming from [facility] intake when bp was noted to be 232/137. Pt not compliant with home medications. C/o headache. No chest pain or difficulty breathing.

## 2019-03-21 ENCOUNTER — Encounter (HOSPITAL_COMMUNITY): Payer: Self-pay | Admitting: Emergency Medicine

## 2019-03-21 MED ORDER — CLONIDINE HCL 0.1 MG PO TABS
0.1000 mg | ORAL_TABLET | ORAL | Status: AC
  Administered 2019-03-21: 01:00:00 0.05 mg via ORAL
  Filled 2019-03-21: qty 1

## 2019-03-21 MED ORDER — AMLODIPINE BESYLATE 5 MG PO TABS: 5.0000 mg | ORAL_TABLET | Freq: Every day | ORAL | 0 refills | Status: DC

## 2019-03-21 NOTE — ED Notes (Signed)
Pt calling out again wanting to leave. Pt getting agitated.

## 2019-03-21 NOTE — ED Notes (Signed)
Staff attempted to recheck pt's bp. Pt refused stating "I am refusing any further treatment. I want to go. I have been here for 6 hours. I only missed a court date" Md made aware. Pt leaving in police custody.

## 2019-03-21 NOTE — ED Notes (Signed)
Pt calling out requesting to leave

## 2019-03-21 NOTE — ED Provider Notes (Signed)
[facility]-EMERGENCY DEPT Provider Note   CSN: [id number] Arrival date & time: 03/20/19  2029     History   Chief Complaint Chief Complaint  Patient presents with  . Hypertension    HPI Bill Hammond is a 43 y.o. male.     The history is provided by the patient.  Hypertension This is a chronic problem. The current episode started more than 1 week ago. The problem occurs constantly. The problem has not changed since onset.Pertinent negatives include no chest pain, no abdominal pain, no headaches and no shortness of breath. Nothing aggravates the symptoms. Nothing relieves the symptoms. He has tried nothing for the symptoms. The treatment provided no relief.  Patient is non-compliant with BP meds.  Has not had them since December.  [facility] the jail.  No CP, no SOB, no weakness or numbness.    Past Medical History:  Diagnosis Date  . ETOH abuse   . Hypertension   . Priapism     Patient Active Problem List   Diagnosis Date Noted  . Priapism, drug-induced   . Priapism     Past Surgical History:  Procedure Laterality Date  . PRIAPISM REPAIR N/A 05/28/2018   Procedure: PENILE SHUNT;  Surgeon: Malen Gauze, MD;  Location: [facility] ORS;  Service: Urology;  Laterality: N/A;  . PRIAPISM REPAIR N/A 06/05/2018   Procedure: DISTAL PENILE SHUNT (TSHUNT);  Surgeon: Sondra Come, MD;  Location: [facility] ORS;  Service: Urology;  Laterality: N/A;        Home Medications    Prior to Admission medications   Medication Sig Start Date End Date Taking? Authorizing Provider  albuterol (VENTOLIN HFA) 108 (90 Base) MCG/ACT inhaler Inhale 2 puffs into the lungs every 6 (six) hours as needed for wheezing or shortness of breath. 12/31/18   Jannifer Rodney A, FNP  ibuprofen (ADVIL,MOTRIN) 200 MG tablet Take 200 mg by mouth every 6 (six) hours as needed.    [provider]  lisinopril-hydrochlorothiazide (PRINZIDE,ZESTORETIC) 10-12.5 MG tablet Take 1 tablet by  mouth daily. 04/29/18   Triplett, Kasandra Knudsen, FNP    Family History No family history on file.  Social History Social History   Tobacco Use  . Smoking status: Current Every Day Smoker    Packs/day: 1.00    Types: Cigarettes  . Smokeless tobacco: Never Used  Substance Use Topics  . Alcohol use: Yes    Comment: drinks 2 40 ounces a day  . 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   Review of Systems Review of Systems  Constitutional: Negative for fever.  HENT: Negative for congestion.   Eyes: Negative for visual disturbance.  Respiratory: Negative for shortness of breath.   Cardiovascular: Negative for chest pain.  Gastrointestinal: Negative for abdominal pain.  Endocrine: Negative for polyuria.  Genitourinary: Negative for difficulty urinating.  Musculoskeletal: Negative for arthralgias.  Neurological: Negative for dizziness, tremors, seizures, syncope, facial asymmetry, speech difficulty, weakness, light-headedness, numbness and headaches.  Psychiatric/Behavioral: Negative for agitation.  All other systems reviewed and are negative.    Physical Exam Updated Vital Signs BP (!) 173/114   Pulse 99   Resp 18   Ht 5\' 10"  (1.778 m)   Wt (!) 138.8 kg   SpO2 98%   BMI 43.91 kg/m   Physical Exam Vitals signs and nursing note reviewed.  Constitutional:  General: He is not in acute distress.    Appearance: Normal appearance.  HENT:     Head: Normocephalic and atraumatic.     Nose: Nose normal.  Eyes:     Extraocular Movements: Extraocular movements intact.     Conjunctiva/sclera: Conjunctivae normal.     Pupils: Pupils are equal, round, and reactive to light.  Neck:     Musculoskeletal: Normal range of motion and neck supple.  Cardiovascular:     Rate and Rhythm: Normal rate and regular rhythm.     Pulses: Normal pulses.     Heart sounds: Normal heart sounds.  Pulmonary:     Effort:  Pulmonary effort is normal.     Breath sounds: Normal breath sounds.  Abdominal:     General: Abdomen is flat. Bowel sounds are normal.     Tenderness: There is no abdominal tenderness. There is no guarding or rebound.  Musculoskeletal: Normal range of motion.  Skin:    General: Skin is warm and dry.     Capillary Refill: Capillary refill takes less than 2 seconds.  Neurological:     General: No focal deficit present.     Mental Status: He is alert and oriented to person, place, and time.     Cranial Nerves: No cranial nerve deficit.     Deep Tendon Reflexes: Reflexes normal.  Psychiatric:        Mood and Affect: Mood normal.        Behavior: Behavior normal.      ED Treatments / Results  Labs (all labs ordered are listed, but only abnormal results are displayed) Labs Reviewed - No data to display  EKG None  Radiology No results found.  Procedures Procedures (including critical care time)  Medications Ordered in ED Medications  amLODipine (NORVASC) tablet 10 mg (10 mg Oral Given 03/20/19 2325)  ibuprofen (ADVIL) tablet 400 mg (400 mg Oral Given 03/20/19 2349)     Patient is asymptomatic.  Given medication in the ED.  Will write script.  Based on the [place] 7 there is no indication for emergent reduction and this may actually be harmful.  Patient needs to start taking medication again and follow up with PMD.  Strict return precautions given.    Bill Hammond was evaluated in Emergency Department on 03/21/2019 for the symptoms described in the history of present illness. He was evaluated in the context of the global COVID-19 pandemic, which necessitated consideration that the patient might be at risk for infection with the SARS-CoV-2 virus that causes COVID-19. Institutional protocols and algorithms that pertain to the evaluation of patients at risk for COVID-19 are in a state of rapid change based on information released by regulatory bodies including the CDC and federal and  state organizations. These policies and algorithms were followed during the patient's care in the ED.   Final Clinical Impressions(s) / ED Diagnoses   Return for weakness, numbness, changes in vision or speech, fevers >100.4 unrelieved by medication, shortness of breath, intractable vomiting, or diarrhea, abdominal pain, Inability to tolerate liquids or food, cough, altered mental status or any concerns. No signs of systemic illness or infection. The patient is nontoxic-appearing on exam and vital signs are within normal limits.   I have reviewed the triage vital signs and the nursing notes. Pertinent labs &imaging results that were available during my care of the patient were reviewed by me and considered in my medical decision making (see chart for details).  After history, exam, and  medical workup I feel the patient has been appropriately medically screened and is safe for discharge home. Pertinent diagnoses were discussed with the patient. Patient was given return precautions    Carlie Solorzano, MD 03/21/19 [id number]

## 2019-03-21 NOTE — ED Notes (Signed)
Pt wants to leave now. Refusing all treatment.

## 2019-07-24 ENCOUNTER — Emergency Department (HOSPITAL_COMMUNITY)
Admission: EM | Admit: 2019-07-24 | Discharge: 2019-07-24 | Disposition: A | Discharge status: Home / Self Care | Payer: Self-pay | Attending: Emergency Medicine | Admitting: Emergency Medicine

## 2019-07-24 ENCOUNTER — Encounter (HOSPITAL_COMMUNITY): Payer: Self-pay

## 2019-07-24 ENCOUNTER — Other Ambulatory Visit: Payer: Self-pay

## 2019-07-24 DIAGNOSIS — K0889 Other specified disorders of teeth and supporting structures: Secondary | ICD-10-CM | POA: Insufficient documentation

## 2019-07-24 DIAGNOSIS — Z79899 Other long term (current) drug therapy: Secondary | ICD-10-CM | POA: Insufficient documentation

## 2019-07-24 DIAGNOSIS — F17210 Nicotine dependence, cigarettes, uncomplicated: Secondary | ICD-10-CM | POA: Insufficient documentation

## 2019-07-24 DIAGNOSIS — I10 Essential (primary) hypertension: Secondary | ICD-10-CM | POA: Insufficient documentation

## 2019-07-24 MED ORDER — DICLOFENAC SODIUM 50 MG PO TBEC: 50.0000 mg | DELAYED_RELEASE_TABLET | Freq: Two times a day (BID) | ORAL | 0 refills | Status: DC

## 2019-07-24 MED ORDER — BUPIVACAINE HCL 0.5 % IJ SOLN
5.0000 mL | Freq: Once | INTRAMUSCULAR | Status: DC
  Filled 2019-07-24: qty 5

## 2019-07-24 MED ORDER — OXYCODONE-ACETAMINOPHEN 5-325 MG PO TABS
1.0000 | ORAL_TABLET | Freq: Once | ORAL | Status: AC
  Administered 2019-07-24: 19:00:00 1 via ORAL
  Filled 2019-07-24: qty 1

## 2019-07-24 MED ORDER — AMOXICILLIN 500 MG PO CAPS: 500.0000 mg | ORAL_CAPSULE | Freq: Three times a day (TID) | ORAL | 0 refills | Status: AC

## 2019-07-24 NOTE — ED Provider Notes (Signed)
[name] EMERGENCY DEPARTMENT Provider Note   CSN: [id number] Arrival date & time: 07/24/19  1654     History Chief Complaint  Patient presents with  . Dental Pain    Bill Hammond is a 44 y.o. male.  HPI     44 year old male presents with a 1 day history of left lower jaw pain.  Patient states that he has had a broken tooth along the left lower jaw for approximately a month.  He notes increased pain that started today.  He is concerned that he has an abscess in this area.  He denies any fevers, nausea, vomiting, abdominal pain.  He denies any difficulty swallowing or breathing.   Past Medical History:  Diagnosis Date  . ETOH abuse   . Hypertension   . Priapism     Patient Active Problem List   Diagnosis Date Noted  . Priapism, drug-induced   . Priapism     Past Surgical History:  Procedure Laterality Date  . PRIAPISM REPAIR N/A 05/28/2018   Procedure: PENILE SHUNT;  Surgeon: Malen Gauze, MD;  Location: [facility] ORS;  Service: Urology;  Laterality: N/A;  . PRIAPISM REPAIR N/A 06/05/2018   Procedure: DISTAL PENILE SHUNT (TSHUNT);  Surgeon: Sondra Come, MD;  Location: [facility] ORS;  Service: Urology;  Laterality: N/A;       No family history on file.  Social History   Tobacco Use  . Smoking status: Current Every Day Smoker    Packs/day: 1.00    Types: Cigarettes  . Smokeless tobacco: Never Used  Substance Use Topics  . Alcohol use: Yes    Comment: drinks 2 40 ounces a day  . 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 Authorizing Provider  albuterol (VENTOLIN HFA) 108 (90 Base) MCG/ACT inhaler Inhale 2 puffs into the lungs every 6 (six) hours as needed for wheezing or shortness of breath. 12/31/18   Jannifer Rodney A, FNP  amLODipine (NORVASC) 5 MG tablet Take 1 tablet (5 mg total) by mouth daily. 03/21/19   Palumbo,  April, MD  ibuprofen (ADVIL,MOTRIN) 200 MG tablet Take 200 mg by mouth every 6 (six) hours as needed.    [provider]  lisinopril-hydrochlorothiazide (PRINZIDE,ZESTORETIC) 10-12.5 MG tablet Take 1 tablet by mouth daily. 04/29/18   Chinita Pester, FNP    Allergies    Patient has no known allergies.  Review of Systems   Review of Systems  Constitutional: Negative for chills and fever.  HENT: Positive for dental problem. Negative for sore throat and trouble swallowing.   Respiratory: Negative for shortness of breath.   Cardiovascular: Negative for chest pain.  Gastrointestinal: Negative for abdominal pain, nausea and vomiting.    Physical Exam Updated Vital Signs BP (!) 205/136 (BP Location: Left Arm)   Pulse (!) 115   Temp 98.6 F (37 C) (Oral)   Resp 18   Ht 6' (1.829 m)   Wt 130.2 kg   SpO2 98%   BMI 38.92 kg/m   Physical Exam Vitals and nursing note reviewed.  Constitutional:      Appearance: He is well-developed.  HENT:     Head: Normocephalic and atraumatic.     Mouth/Throat:     Mouth: No angioedema.     Tongue: No lesions. Tongue does not deviate from midline.  Palate: No lesions.     Pharynx: Oropharynx is clear. Uvula midline.   Eyes:     Conjunctiva/sclera: Conjunctivae normal.  Cardiovascular:     Rate and Rhythm: Regular rhythm. Tachycardia present.     Heart sounds: Normal heart sounds. No murmur.  Pulmonary:     Effort: Pulmonary effort is normal. No respiratory distress.     Breath sounds: Normal breath sounds. No wheezing or rales.  Abdominal:     General: Bowel sounds are normal. There is no distension.     Palpations: Abdomen is soft.     Tenderness: There is no abdominal tenderness.  Musculoskeletal:        General: No tenderness or deformity. Normal range of motion.     Cervical back: Neck supple.  Skin:    General: Skin is warm and dry.     Findings: No erythema or rash.  Neurological:     Mental Status: He is alert and  oriented to person, place, and time.  Psychiatric:        Behavior: Behavior normal.     ED Results / Procedures / Treatments   Labs (all labs ordered are listed, but only abnormal results are displayed) Labs Reviewed - No data to display  EKG None  Radiology No results found.  Procedures Dental Block  Date/Time: 07/24/2019 7:16 PM Performed by: Clayborne Artist, PA-C Authorized by: Clayborne Artist, PA-C   Consent:    Consent obtained:  Verbal   Consent given by:  Patient   Risks discussed:  Swelling, unsuccessful block and pain   Alternatives discussed:  No treatment and alternative treatment Indications:    Indications: dental abscess and dental pain   Location:    Block type:  Supraperiosteal   Supraperiosteal location:  Lower teeth   Lower teeth location:  19/LL 1st molar Procedure details (see MAR for exact dosages):    Topical anesthetic:  Benzocaine gel   Needle gauge:  27 G   Anesthetic injected:  Bupivacaine 0.5% w/o epi   Injection procedure:  Introduced needle and anatomic landmarks identified Post-procedure details:    Outcome:  Pain improved   (including critical care time)  Medications Ordered in ED Medications  bupivacaine (MARCAINE) 0.5 % (with pres) injection 5 mL (has no administration in time range)    ED Course  I have reviewed the triage vital signs and the nursing notes.  Pertinent labs & imaging results that were available during my care of the patient were reviewed by me and considered in my medical decision making (see chart for details).    MDM Rules/Calculators/A&P                      Patient presents with dental pain.  He does have a broken tooth noted in the left lower molar.  There are some tenderness to palpation of the gum.  No palpable or defined abscess to drain at this time.  Patient noted to be tachycardic and hypertensive.  He states this is secondary to pain.  No evidence of Ludwig's angina. Dental block performed  by PA student, Marisue Ivan.  Patient noted improvement in pain.  Tachycardia slightly improved and blood pressure slightly improved.  However when I went to reevaluate the patient he states that he is ready to go.  I discussed that his vital signs are normal and that we should address his blood pressure and heart rate.  He states that he has blood pressure medicine at  home that he will take and that his heart rate will be elevated as long as he is in the emergency room.  He states that he has something to check his blood pressure at home.  Currently has asymptomatic hypertension.  He understands his vital signs are normal.  He was given return precautions.  He is ready and stable for discharge.     At this time there does not appear to be any evidence of an acute emergency medical condition and the patient appears stable for discharge with appropriate outpatient follow up.Diagnosis was discussed with patient who verbalizes understanding and is agreeable to discharge.    Final Clinical Impression(s) / ED Diagnoses Final diagnoses:  None    Rx / DC Orders ED Discharge Orders    None       Rachel Moulds 07/25/19 1106    Tegeler, Gwenyth Allegra, MD 07/25/19 [phone]

## 2019-07-24 NOTE — ED Triage Notes (Signed)
Pt reports waking this am w/ Left lower toothache.

## 2019-07-24 NOTE — Discharge Instructions (Addendum)
Take amoxicillin as prescribed.  Take Motrin as needed for pain.  Follow-up with a dentist as soon as possible for continued evaluation.  Return to the emergency room immediately for new or worsening symptoms or concerns, such as difficulty swallowing, difficulty breathing, increased swelling, fevers, vomiting or any concerns at all.

## 2019-08-02 ENCOUNTER — Emergency Department
Admission: EM | Admit: 2019-08-02 | Discharge: 2019-08-02 | Disposition: A | Discharge status: Home / Self Care | Payer: Self-pay | Attending: Emergency Medicine | Admitting: Emergency Medicine

## 2019-08-02 ENCOUNTER — Encounter: Payer: Self-pay | Admitting: Emergency Medicine

## 2019-08-02 ENCOUNTER — Other Ambulatory Visit: Payer: Self-pay

## 2019-08-02 DIAGNOSIS — Z79899 Other long term (current) drug therapy: Secondary | ICD-10-CM | POA: Insufficient documentation

## 2019-08-02 DIAGNOSIS — Z9119 Patient's noncompliance with other medical treatment and regimen: Secondary | ICD-10-CM | POA: Insufficient documentation

## 2019-08-02 DIAGNOSIS — I10 Essential (primary) hypertension: Secondary | ICD-10-CM | POA: Insufficient documentation

## 2019-08-02 DIAGNOSIS — K0889 Other specified disorders of teeth and supporting structures: Secondary | ICD-10-CM | POA: Insufficient documentation

## 2019-08-02 DIAGNOSIS — Z91199 Patient's noncompliance with other medical treatment and regimen due to unspecified reason: Secondary | ICD-10-CM

## 2019-08-02 DIAGNOSIS — F17210 Nicotine dependence, cigarettes, uncomplicated: Secondary | ICD-10-CM | POA: Insufficient documentation

## 2019-08-02 MED ORDER — ACETAMINOPHEN-CODEINE 300-30 MG PO TABS: 1.0000 | ORAL_TABLET | Freq: Four times a day (QID) | ORAL | 0 refills | Status: DC | PRN

## 2019-08-02 MED ORDER — AMOXICILLIN 875 MG PO TABS: 875.0000 mg | ORAL_TABLET | Freq: Two times a day (BID) | ORAL | 0 refills | Status: DC

## 2019-08-02 MED ORDER — LISINOPRIL-HYDROCHLOROTHIAZIDE 10-12.5 MG PO TABS: 1.0000 | ORAL_TABLET | Freq: Every day | ORAL | 1 refills | Status: DC

## 2019-08-02 NOTE — ED Triage Notes (Signed)
FIRST NURSE NOTE:  Pt c/o left side dental pain for several days.  Recently on antibiotics, but pain returned after he stopped them.

## 2019-08-02 NOTE — ED Provider Notes (Addendum)
[facility] Emergency Department Provider Note   ____________________________________________   First MD Initiated Contact with Patient 08/02/19 [phone]     (approximate)  I have reviewed the triage vital signs and the nursing notes.   HISTORY  Chief Complaint Dental Pain   HPI Bill Hammond is a 44 y.o. male presents to the ED with complaint of upper dental pain on the left that has been going on for "some time".  Patient states that it hurts to eat.  He also complains of headache on that side of his head.  He states that he was taking antibiotics which helped with the pain and the pain started again after finishing the antibiotic.  He has not called a dentist for an appointment.  He also does not have a PCP and states that he has been off his blood pressure medication for a year.  He states that at approximately 630 he took the lisinopril/hydrochlorothiazide that was "left over".  Patient denies any headache, chest pain, shortness of breath or visual changes.  Patient states that his blood pressure has been higher than what it is now.  He rates his dental pain as a 10/10.       Past Medical History:  Diagnosis Date  . ETOH abuse   . Hypertension   . Priapism     Patient Active Problem List   Diagnosis Date Noted  . Priapism, drug-induced   . Priapism     Past Surgical History:  Procedure Laterality Date  . PRIAPISM REPAIR N/A 05/28/2018   Procedure: PENILE SHUNT;  Surgeon: Malen Gauze, MD;  Location: [facility] ORS;  Service: Urology;  Laterality: N/A;  . PRIAPISM REPAIR N/A 06/05/2018   Procedure: DISTAL PENILE SHUNT (TSHUNT);  Surgeon: Sondra Come, MD;  Location: [facility] ORS;  Service: Urology;  Laterality: N/A;    Prior to Admission medications   Medication Sig Start Date End Date Taking? Authorizing Provider  Acetaminophen-Codeine 300-30 MG tablet Take 1 tablet by mouth every 6 (six) hours as needed for pain. 08/02/19   Tommi Rumps, PA-C   albuterol (VENTOLIN HFA) 108 (90 Base) MCG/ACT inhaler Inhale 2 puffs into the lungs every 6 (six) hours as needed for wheezing or shortness of breath. 12/31/18   Jannifer Rodney A, FNP  amLODipine (NORVASC) 5 MG tablet Take 1 tablet (5 mg total) by mouth daily. 03/21/19   Palumbo, April, MD  amoxicillin (AMOXIL) 875 MG tablet Take 1 tablet (875 mg total) by mouth 2 (two) times daily. 08/02/19   Tommi Rumps, PA-C  diclofenac (VOLTAREN) 50 MG EC tablet Take 1 tablet (50 mg total) by mouth 2 (two) times daily. 07/24/19   Kendrick, Caitlyn S, PA-C  ibuprofen (ADVIL,MOTRIN) 200 MG tablet Take 200 mg by mouth every 6 (six) hours as needed.    [provider]  lisinopril-hydrochlorothiazide (ZESTORETIC) 10-12.5 MG tablet Take 1 tablet by mouth daily. 08/02/19   Tommi Rumps, PA-C    Allergies Patient has no known allergies.  History reviewed. No pertinent family history.  Social History Social History   Tobacco Use  . Smoking status: Current Every Day Smoker    Packs/day: 1.00    Types: Cigarettes  . Smokeless tobacco: Never Used  Substance Use Topics  . Alcohol use: Yes    Comment: drinks 2 40 ounces a day  . 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 ENT: No sore throat.  Positive for dental pain. Cardiovascular: Denies chest pain. Respiratory: Denies shortness of breath. Gastrointestinal:   No nausea, no vomiting. Musculoskeletal: Negative for muscle aches. Skin: Negative for rash. Neurological: Negative for headaches, focal weakness or numbness. ____________________________________________   PHYSICAL EXAM:  VITAL SIGNS: ED Triage Vitals  Enc Vitals Group     BP 08/02/19 0919 (S) (!) 187/113     Pulse Rate 08/02/19 0919 (!) 105     Resp 08/02/19 0919 18     Temp 08/02/19 0919 98 F (36.7 C)     Temp Source 08/02/19 0919 Oral     SpO2 08/02/19 0919 97 %      Weight 08/02/19 0916 287 lb (130.2 kg)     Height 08/02/19 0916 6' (1.829 m)     Head Circumference --      Peak Flow --      Pain Score 08/02/19 0916 10     Pain Loc --      Pain Edu? --      Excl. in GC? --    Constitutional: Alert and oriented. Well appearing and in no acute distress. Eyes: Conjunctivae are normal.  Head: Atraumatic. Nose: No congestion/rhinnorhea. Mouth/Throat: Mucous membranes are moist.  Oropharynx non-erythematous.  Left upper premolar has a large cary present.  No active drainage or fistula is noted. Neck: No stridor.   Hematological/Lymphatic/Immunilogical: No cervical lymphadenopathy. Cardiovascular: Normal rate, regular rhythm. Grossly normal heart sounds.  Good peripheral circulation. Respiratory: Normal respiratory effort.  No retractions. Lungs CTAB. Musculoskeletal: Moves upper and lower extremities that any difficulty.  Normal gait was noted. Neurologic:  Normal speech and language. No gross focal neurologic deficits are appreciated. No gait instability. Skin:  Skin is warm, dry and intact. No rash noted. Psychiatric: Mood and affect are normal. Speech and behavior are normal.  ____________________________________________   LABS (all labs ordered are listed, but only abnormal results are displayed)  Labs Reviewed - No data to display   PROCEDURES  Procedure(s) performed (including Critical Care):  Procedures   ____________________________________________   INITIAL IMPRESSION / ASSESSMENT AND PLAN / ED COURSE  As part of my medical decision making, I reviewed the following data within the electronic MEDICAL RECORD NUMBER Notes from prior ED visits and [place] Controlled Substance Database  44 year old male presents to the ED with complaint of left upper dental pain.  Patient states that he was on antibiotics previously and this stopped his dental pain.  He does not have a dentist and did not make an appointment with any of the clinics.  Patient also  has a history of hypertension has not taken his blood pressure medication for 1 year that he admits to.  He states he had leftover lisinopril and took 1 this morning at approximately 6:30 AM.  He states he does not have a PCP and does not routinely take blood pressure medication.  He denies any chest pain, shortness of breath, dizziness or headache.  We discussed all the issues that can come of hypertension that is uncontrolled or not treated.  Patient acknowledges that he understands.  A prescription for amoxicillin 875 twice daily, lisinopril/hydrochlorothiazide 10/12.51 daily and Tylenol 3 was sent to his pharmacy.  He is encouraged to go to one of the many clinics is listed on his discharge papers including the [facility].  He was also given a list of dental clinics and also the hours for the walk-in clinic  at [facility].  Prior to discharge patient's blood pressure was rechecked and was approximately the same.  In looking through his records he has had higher blood pressures and he trends high due to his noncompliance.  ____________________________________________   FINAL CLINICAL IMPRESSION(S) / ED DIAGNOSES  Final diagnoses:  Pain, dental  Hypertension, uncontrolled  Medically noncompliant     ED Discharge Orders         Ordered    amoxicillin (AMOXIL) 875 MG tablet  2 times daily     08/02/19 1041    lisinopril-hydrochlorothiazide (ZESTORETIC) 10-12.5 MG tablet  Daily     08/02/19 1041    Acetaminophen-Codeine 300-30 MG tablet  Every 6 hours PRN     08/02/19 1041           Note:  This document was prepared using Dragon voice recognition software and may include unintentional dictation errors.    Johnn Hai, PA-C 08/02/19 1428    Johnn Hai, PA-C 08/02/19 1429    Vanessa Millen, MD 08/03/19 [phone]

## 2019-08-02 NOTE — ED Notes (Signed)
Pt very rude - argumentative. Advised to follow up with his pcp regarding his bp and dentist regarding his tooth pain. States worried about taking codeine - advised to take tylenol or motrin otherwise do not drive on codeine. Shown to the exit as he still continued to argue and be rude and loud.

## 2019-08-02 NOTE — ED Notes (Signed)
Pt states upper tooth/jaw pain that has been ongoing for some time. Pt states it hurts to eat/swallow and has c/o of headache. No facial swelling noted.

## 2019-08-02 NOTE — Discharge Instructions (Addendum)
Call all of the clinics listed on your discharge papers including the [facility] to begin getting management for your hypertension.  Your blood pressure is seriously elevated and can cause problems in the future including heart attack, stroke, visual changes and organ failure.  A prescription for your blood pressure medication was sent to your pharmacy.  Begin taking it and it also has 1 refill.  The [facility] is free the other clinics are based on your income.  Also take amoxicillin until completely finished.  Tylenol with codeine only as directed and do not drive while taking this medication.  A list of dental clinics is on your discharge papers.  Also a separate sheet has the information about the walk-in clinic at [facility].  OPTIONS FOR DENTAL FOLLOW UP CARE  [place] [facility] and [place] [email or web address] [phone])  [facility] [phone])  [place] [phone])  [place] [phone])  [facility] [phone]) This clinic caters to the indigent population and is on a lottery system. Location: [employer] [facility], [place], [place], [place] Clinic Hours: Wednesdays from 6pm - 9pm, patients seen by a lottery system. For dates, call or go to [email or web address] Services: Cleanings, fillings and simple extractions. Payment Options: DENTAL WORK IS FREE OF CHARGE. Bring proof of income or support. Best way to get seen: Arrive at 5:15 pm - this is a lottery, NOT first come/first serve, so arriving earlier will not increase your chances of being seen.     [facility] [phone] Select option 1 for emergencies   Location: [facility], [place], [address] [place] [facility]  Hours: No walk-ins accepted - call the day before to schedule an appointment. Check in times are 9:30 am and 1:30 pm. Services: Simple extractions, temporary fillings, pulpectomy/pulp debridement, uncomplicated abscess drainage. Payment Options: PAYMENT IS DUE AT THE TIME OF SERVICE.  Fee is usually $100-200, additional surgical procedures (e.g. abscess drainage) may be extra. Cash, checks, Visa/MasterCard accepted.  Can file Medicaid if patient is covered for dental - patient should call case worker to check. No discount for [facility] patients. Best way to get seen: MUST call the day before and get onto the schedule. Can usually be seen the next 1-2 days. No walk-ins accepted.     [place] [phone]   Location: [place], [place] [facility] Hours: M, W, Th, F 8am or 1:30pm, Tues 9a or 1:30 - first come/first served. Services: Simple extractions, temporary fillings, uncomplicated abscess drainage.  You do not need to be an [facility] resident. Payment Options: PAYMENT IS DUE AT THE TIME OF SERVICE. Dental insurance, otherwise sliding scale - bring proof of income or support. Depending on income and treatment needed, cost is usually $50-200. Best way to get seen: Arrive early as it is first come/first served.     [facility] [phone]   Location: [place] [facility] Hours: Mon-Thu 8a-5p Services: Most basic dental services including extractions and fillings. Payment Options: PAYMENT IS DUE AT THE TIME OF SERVICE. Sliding scale, up to 50% off - bring proof if income or support. Medicaid with dental option accepted. Best way to get seen: Call to schedule an appointment, can usually be seen within 2 weeks OR they will try to see walk-ins -  show up at 8a or 2p (you may have to wait).     [facility] [phone] [place] RESIDENTS ONLY    Location: [facility], [address], [place], [place] [address] Clinic Hours: By appointment only. Monday - Thursday 8am-5pm, Friday 8am-12pm Services: Cleanings, fillings, extractions. Payment Options: PAYMENT IS DUE AT THE TIME OF SERVICE. Cash, Visa or MasterCard. Sliding scale - $30 minimum per service. Best way to get seen: Come in to office, complete packet and make an appointment - need proof of income or support monies for each household member and proof of [facility] residence. Usually takes about a month to get in.     [facility] [phone]   Location: [address], [facility] Hours: Walk-in Urgent Care Dental Services are offered Monday-Friday mornings only. The numbers of emergencies accepted daily is limited to the number of providers available. Maximum 15 - Mondays, Wednesdays & Thursdays Maximum 10 - Tuesdays & Fridays Services: You do not need to be a [facility] resident to be seen for a dental emergency. Emergencies are defined as pain, swelling, abnormal bleeding, or dental trauma. Walkins will receive x-rays if needed. NOTE: Dental cleaning is not an emergency. Payment Options: PAYMENT IS DUE AT THE TIME OF SERVICE. Minimum co-pay is $40.00 for uninsured patients. Minimum co-pay is $3.00 for Medicaid with dental coverage. Dental Insurance is accepted and must be presented at time of visit. Medicare does not cover dental. Forms of payment: Cash, credit card, checks. Best way to get seen: If not previously registered with the clinic, walk-in dental registration begins at 7:15 am and is on a first come/first serve basis. If previously registered with the clinic, call to make an appointment.     [facility] [phone] [place] RESIDENTS ONLY   Location: [address] [place], [place] Clinic Hours: Mon-Thu 10a-2p Services: Extractions only! Payment Options: FREE  (donations accepted) - bring proof of income or support Best way to get seen: Call and schedule an appointment OR come at 8am on the 1st Monday of every month (except for holidays) when it is first come/first served.     [facility] [phone]   Location: [address] [place], [place] Clinic Hours: Friday mornings Services, Payment Options, Best way to get seen: Call for info

## 2020-08-14 ENCOUNTER — Emergency Department
Admission: EM | Admit: 2020-08-14 | Discharge: 2020-08-14 | Disposition: A | Discharge status: Home / Self Care | Payer: Self-pay | Attending: Student in an Organized Health Care Education/Training Program | Admitting: Student in an Organized Health Care Education/Training Program

## 2020-08-14 ENCOUNTER — Other Ambulatory Visit: Payer: Self-pay

## 2020-08-14 ENCOUNTER — Encounter: Payer: Self-pay | Admitting: Emergency Medicine

## 2020-08-14 DIAGNOSIS — I10 Essential (primary) hypertension: Secondary | ICD-10-CM | POA: Insufficient documentation

## 2020-08-14 DIAGNOSIS — M79672 Pain in left foot: Secondary | ICD-10-CM | POA: Insufficient documentation

## 2020-08-14 DIAGNOSIS — F17210 Nicotine dependence, cigarettes, uncomplicated: Secondary | ICD-10-CM | POA: Insufficient documentation

## 2020-08-14 DIAGNOSIS — M79671 Pain in right foot: Secondary | ICD-10-CM | POA: Insufficient documentation

## 2020-08-14 DIAGNOSIS — Z79899 Other long term (current) drug therapy: Secondary | ICD-10-CM | POA: Insufficient documentation

## 2020-08-14 HISTORY — DX: Disorder of kidney and ureter, unspecified: N28.9

## 2020-08-14 LAB — COMPREHENSIVE METABOLIC PANEL
ALT: 16 U/L (ref 0–44)
AST: 25 U/L (ref 15–41)
Albumin: 3.6 g/dL (ref 3.5–5.0)
Alkaline Phosphatase: 91 U/L (ref 38–126)
Anion gap: 6 (ref 5–15)
BUN: 16 mg/dL (ref 6–20)
CO2: 22 mmol/L (ref 22–32)
Calcium: 8.6 mg/dL — ABNORMAL LOW (ref 8.9–10.3)
Chloride: 108 mmol/L (ref 98–111)
Creatinine, Ser: 0.7 mg/dL (ref 0.61–1.24)
GFR, Estimated: >60 mL/min (ref >60)
Glucose, Bld: 138 mg/dL — ABNORMAL HIGH (ref 70–99)
Potassium: 4.3 mmol/L (ref 3.5–5.1)
Sodium: 136 mmol/L (ref 135–145)
Total Bilirubin: 0.6 mg/dL (ref 0.3–1.2)
Total Protein: 7.3 g/dL (ref 6.5–8.1)

## 2020-08-14 LAB — CBC WITH DIFFERENTIAL/PLATELET
Abs Immature Granulocytes: 0.03 10*3/uL (ref 0.00–0.07)
Basophils Absolute: 0 10*3/uL (ref 0.0–0.1)
Basophils Relative: 0 %
Eosinophils Absolute: 0.2 10*3/uL (ref 0.0–0.5)
Eosinophils Relative: 3 %
HCT: 36.6 % — ABNORMAL LOW (ref 39.0–52.0)
Hemoglobin: 11.4 g/dL — ABNORMAL LOW (ref 13.0–17.0)
Immature Granulocytes: 0 %
Lymphocytes Relative: 20 %
Lymphs Abs: 1.6 10*3/uL (ref 0.7–4.0)
MCH: 27.9 pg (ref 26.0–34.0)
MCHC: 31.1 g/dL (ref 30.0–36.0)
MCV: 89.5 fL (ref 80.0–100.0)
Monocytes Absolute: 0.7 10*3/uL (ref 0.1–1.0)
Monocytes Relative: 9 %
Neutro Abs: 5.2 10*3/uL (ref 1.7–7.7)
Neutrophils Relative %: 68 %
Platelets: 288 10*3/uL (ref 150–400)
RBC: 4.09 MIL/uL — ABNORMAL LOW (ref 4.22–5.81)
RDW: 15.7 % — ABNORMAL HIGH (ref 11.5–15.5)
WBC: 7.8 10*3/uL (ref 4.0–10.5)
nRBC: 0 % (ref 0.0–0.2)

## 2020-08-14 LAB — URIC ACID: Uric Acid, Serum: 6.7 mg/dL (ref 3.7–8.6)

## 2020-08-14 MED ORDER — LISINOPRIL-HYDROCHLOROTHIAZIDE 10-12.5 MG PO TABS: 1.0000 | ORAL_TABLET | Freq: Every day | ORAL | 2 refills | Status: DC

## 2020-08-14 MED ORDER — KETOROLAC TROMETHAMINE 30 MG/ML IJ SOLN
30.0000 mg | Freq: Once | INTRAMUSCULAR | Status: AC
  Administered 2020-08-14: 30 mg via INTRAMUSCULAR
  Filled 2020-08-14: qty 1

## 2020-08-14 NOTE — ED Provider Notes (Signed)
[facility] Emergency Department Provider Note  ____________________________________________   Event Date/Time   First MD Initiated Contact with Patient 08/14/20 [phone]     (approximate)  I have reviewed the triage vital signs and the nursing notes.   HISTORY  Chief Complaint Foot Pain   HPI Bill Hammond is a 45 y.o. male presents to the ED with complaint of bilateral feet pain.  Patient also states that his feet are swollen.  He reports that he had been taking his blood pressure medication which contains hydrochlorothiazide.  He has not had any of this medicine to take in the last 3 weeks as he was released from jail and did not have medication to continue.  He denies any shortness of breath, chest pain, nausea or vomiting.  There is been no injury to his feet.  He rates discomfort is a 8 out of 10.         Past Medical History:  Diagnosis Date  . ETOH abuse   . Hypertension   . Priapism   . Renal disorder    kidney failure    Patient Active Problem List   Diagnosis Date Noted  . Priapism, drug-induced   . Priapism     Past Surgical History:  Procedure Laterality Date  . PRIAPISM REPAIR N/A 05/28/2018   Procedure: PENILE SHUNT;  Surgeon: Malen Gauze, MD;  Location: [facility] ORS;  Service: Urology;  Laterality: N/A;  . PRIAPISM REPAIR N/A 06/05/2018   Procedure: DISTAL PENILE SHUNT (TSHUNT);  Surgeon: Sondra Come, MD;  Location: [facility] ORS;  Service: Urology;  Laterality: N/A;    Prior to Admission medications   Medication Sig Start Date End Date Taking? Authorizing Provider  Acetaminophen-Codeine 300-30 MG tablet Take 1 tablet by mouth every 6 (six) hours as needed for pain. 08/02/19   Tommi Rumps, PA-C  albuterol (VENTOLIN HFA) 108 (90 Base) MCG/ACT inhaler Inhale 2 puffs into the lungs every 6 (six) hours as needed for wheezing or shortness of breath. 12/31/18   Jannifer Rodney A, FNP  ibuprofen (ADVIL,MOTRIN) 200 MG tablet Take 200  mg by mouth every 6 (six) hours as needed.    [provider]  lisinopril-hydrochlorothiazide (ZESTORETIC) 10-12.5 MG tablet Take 1 tablet by mouth daily. 08/14/20   Tommi Rumps, PA-C  amLODipine (NORVASC) 5 MG tablet Take 1 tablet (5 mg total) by mouth daily. 03/21/19 08/14/20  Palumbo, April, MD    Allergies Patient has no known allergies.  History reviewed. No pertinent family history.  Social History Social History   Tobacco Use  . Smoking status: Current Every Day Smoker    Packs/day: 1.00    Types: Cigarettes  . Smokeless tobacco: Never Used  Substance Use Topics  . Alcohol use: Yes    Comment: drinks 2 40 ounces a day  . 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 ENT: No sore throat. Cardiovascular: Denies chest pain. Respiratory: Denies shortness of breath. Gastrointestinal: No abdominal pain.  No nausea, no vomiting.   Genitourinary: Negative for dysuria. Musculoskeletal: Bilateral foot pain. Skin: Negative for rash. Neurological: Negative for headaches, focal weakness or numbness. ____________________________________________   PHYSICAL EXAM:  VITAL SIGNS: ED Triage Vitals  Enc Vitals Group     BP 08/14/20 0914 (!) 179/102     Pulse Rate 08/14/20 0914 99     Resp  08/14/20 0914 16     Temp 08/14/20 0914 98.6 F (37 C)     Temp Source 08/14/20 0914 Oral     SpO2 08/14/20 0914 100 %     Weight 08/14/20 0915 (!) 315 lb (142.9 kg)     Height 08/14/20 0915 6' (1.829 m)     Head Circumference --      Peak Flow --      Pain Score 08/14/20 0915 8     Pain Loc --      Pain Edu? --      Excl. in GC? --     Constitutional: Alert and oriented. Well appearing and in no acute distress. Eyes: Conjunctivae are normal.  Head: Atraumatic. Neck: No stridor.   Cardiovascular: Normal rate, regular rhythm. Grossly normal heart sounds.  Good peripheral  circulation. Respiratory: Normal respiratory effort.  No retractions. Lungs CTAB. Gastrointestinal: Soft and nontender. No distention.  Musculoskeletal: Moves upper and lower extremities with any difficulty.  Patient is able to ambulate without any assistance.  On examination bilateral lower extremities there is no gross deformity and minimal soft tissue edema present bilateral feet.  Pulses are present bilaterally.  Skin is intact.  No erythema or warmth is noted.  Patient is able to flex and extend without any difficulty.  Good muscle strength bilaterally.  No pitting edema is present to the feet or tib-fib area. Neurologic:  Normal speech and language. No gross focal neurologic deficits are appreciated. No gait instability. Skin:  Skin is warm, dry and intact. No rash noted. Psychiatric: Mood and affect are normal. Speech and behavior are normal.  ____________________________________________   LABS (all labs ordered are listed, but only abnormal results are displayed)  Labs Reviewed  CBC WITH DIFFERENTIAL/PLATELET - Abnormal; Notable for the following components:      Result Value   RBC 4.09 (*)    Hemoglobin 11.4 (*)    HCT 36.6 (*)    RDW 15.7 (*)    All other components within normal limits  COMPREHENSIVE METABOLIC PANEL - Abnormal; Notable for the following components:   Glucose, Bld 138 (*)    Calcium 8.6 (*)    All other components within normal limits  URIC ACID     PROCEDURES  Procedure(s) performed (including Critical Care):  Procedures   ____________________________________________   INITIAL IMPRESSION / ASSESSMENT AND PLAN / ED COURSE  As part of my medical decision making, I reviewed the following data within the electronic MEDICAL RECORD NUMBER Notes from prior ED visits and [place] Controlled Substance Database  45 year old male presents to the ED with complaint of bilateral feet pain.  Patient states that his feet began swelling couple weeks ago.  Patient states  that he was released from jail on February 26.  While in the jail he was continued on his lisinopril-hydrochlorothiazide but was not given a prescription to continue.  Patient had an old prescription at home but disposed of that when he saw that the medication had expired.  Lab work is reassuring and patient was made aware.  A prescription for lisinopril-hydrochlorothiazide was sent to his pharmacy.  The last prescription that we have documented in his record was for the 10 mg / 12.5 mg pill.  Patient is strongly encouraged to call and make an appointment with one of the clinics listed on his discharge papers so that he may have a primary care provider to follow-up with on his blood pressure. ____________________________________________   FINAL CLINICAL IMPRESSION(S) / ED  DIAGNOSES  Final diagnoses:  Bilateral foot pain  Hypertension, uncontrolled     ED Discharge Orders         Ordered    lisinopril-hydrochlorothiazide (ZESTORETIC) 10-12.5 MG tablet  Daily        08/14/20 1033          *Please note:  Bill Hammond was evaluated in Emergency Department on 08/14/2020 for the symptoms described in the history of present illness. He was evaluated in the context of the global COVID-19 pandemic, which necessitated consideration that the patient might be at risk for infection with the SARS-CoV-2 virus that causes COVID-19. Institutional protocols and algorithms that pertain to the evaluation of patients at risk for COVID-19 are in a state of rapid change based on information released by regulatory bodies including the CDC and federal and state organizations. These policies and algorithms were followed during the patient's care in the ED.  Some ED evaluations and interventions may be delayed as a result of limited staffing during and the pandemic.*   Note:  This document was prepared using Dragon voice recognition software and may include unintentional dictation errors.    Tommi Rumps,  PA-C 08/14/20 1515    Willy Eddy, MD 08/14/20 [phone]

## 2020-08-14 NOTE — ED Triage Notes (Signed)
Patient presents to the ED with foot swelling and pain bilaterally x 2 weeks.  Patient states he was released from jail on Feb. 26th and was not given prescriptions for his daily medications or a doctor to follow up with.  In looking at patient's [facility] from jail, he was taking HCTZ 25mg  and lisinopril 20mg  daily.  Patient denies any shortness of breath, chest pain or nausea and vomiting.  Patient is in no obvious distress at this time.  Patient is walking with a slight limp.

## 2020-08-14 NOTE — Discharge Instructions (Addendum)
Follow-up with your primary care provider if any continued problems or one of the clinics listed on your discharge papers including [facility].  Begin taking your blood pressure medication and continue to take daily.  You will need to obtain a primary care provider to continue monitoring your blood pressure and also blood pressure prescriptions.  Avoid foods that are high in salt and adding salt to your food at the table.

## 2020-08-14 NOTE — ED Notes (Signed)
See triage note  Presents with bilateral feet pain  States pain is to top of feet and moves into toes  Right is worse than left  No injury  Positive swelling

## 2020-09-01 ENCOUNTER — Telehealth: Payer: Self-pay

## 2020-09-01 NOTE — Telephone Encounter (Signed)
After receiving an ER referral, called pt. The line was busy.   MD 09/01/20 @ 3:35 pm

## 2021-12-11 ENCOUNTER — Other Ambulatory Visit: Payer: Self-pay

## 2021-12-11 ENCOUNTER — Emergency Department: Payer: Self-pay

## 2021-12-11 ENCOUNTER — Emergency Department
Admission: EM | Admit: 2021-12-11 | Discharge: 2021-12-11 | Disposition: A | Discharge status: Home / Self Care | Payer: Self-pay | Attending: Emergency Medicine | Admitting: Emergency Medicine

## 2021-12-11 DIAGNOSIS — R42 Dizziness and giddiness: Secondary | ICD-10-CM | POA: Insufficient documentation

## 2021-12-11 DIAGNOSIS — I10 Essential (primary) hypertension: Secondary | ICD-10-CM | POA: Insufficient documentation

## 2021-12-11 DIAGNOSIS — R1012 Left upper quadrant pain: Secondary | ICD-10-CM | POA: Insufficient documentation

## 2021-12-11 LAB — URINALYSIS, ROUTINE W REFLEX MICROSCOPIC
Bilirubin Urine: NEGATIVE
Glucose, UA: NEGATIVE mg/dL
Ketones, ur: NEGATIVE mg/dL
Nitrite: NEGATIVE
Specific Gravity, Urine: 1.025 (ref 1.005–1.030)
pH: 5.5 (ref 5.0–8.0)

## 2021-12-11 LAB — BASIC METABOLIC PANEL
Anion gap: 7 (ref 5–15)
BUN: 11 mg/dL (ref 6–20)
CO2: 21 mmol/L — ABNORMAL LOW (ref 22–32)
Calcium: 8.5 mg/dL — ABNORMAL LOW (ref 8.9–10.3)
Chloride: 107 mmol/L (ref 98–111)
Creatinine, Ser: 0.8 mg/dL (ref 0.61–1.24)
GFR, Estimated: >60 mL/min (ref >60)
Glucose, Bld: 107 mg/dL — ABNORMAL HIGH (ref 70–99)
Potassium: 4.1 mmol/L (ref 3.5–5.1)
Sodium: 135 mmol/L (ref 135–145)

## 2021-12-11 LAB — URINALYSIS, MICROSCOPIC (REFLEX)

## 2021-12-11 LAB — CBC
HCT: 39.7 % (ref 39.0–52.0)
Hemoglobin: 13 g/dL (ref 13.0–17.0)
MCH: 30.2 pg (ref 26.0–34.0)
MCHC: 32.7 g/dL (ref 30.0–36.0)
MCV: 92.3 fL (ref 80.0–100.0)
Platelets: 291 10*3/uL (ref 150–400)
RBC: 4.3 MIL/uL (ref 4.22–5.81)
RDW: 14.3 % (ref 11.5–15.5)
WBC: 7.4 10*3/uL (ref 4.0–10.5)
nRBC: 0 % (ref 0.0–0.2)

## 2021-12-11 MED ORDER — PROCHLORPERAZINE MALEATE 10 MG PO TABS
10.0000 mg | ORAL_TABLET | Freq: Once | ORAL | Status: AC
Start: 2021-12-11 — End: 2021-12-11
  Administered 2021-12-11: 10 mg via ORAL
  Filled 2021-12-11 (×2): qty 1

## 2021-12-11 MED ORDER — HYDROCHLOROTHIAZIDE 12.5 MG PO TABS
12.5000 mg | ORAL_TABLET | Freq: Once | ORAL | Status: AC
Start: 2021-12-11 — End: 2021-12-11
  Administered 2021-12-11: 12.5 mg via ORAL
  Filled 2021-12-11: qty 1

## 2021-12-11 MED ORDER — LISINOPRIL 10 MG PO TABS
10.0000 mg | ORAL_TABLET | Freq: Once | ORAL | Status: AC
  Administered 2021-12-11: 10 mg via ORAL
  Filled 2021-12-11: qty 1

## 2021-12-11 MED ORDER — LISINOPRIL-HYDROCHLOROTHIAZIDE 10-12.5 MG PO TABS: 1.0000 | ORAL_TABLET | Freq: Every day | ORAL | 2 refills | Status: DC

## 2021-12-11 NOTE — ED Triage Notes (Signed)
Pt comes with c/o dizziness, headache, belly pain and HTN. Pt states he is suppose to take BP meds but hasn't been. Pt states this all started on Saturday, but pt took Bp med that was outdated yesterday.

## 2021-12-11 NOTE — ED Provider Notes (Signed)
[facility] Provider Note    Event Date/Time   First MD Initiated Contact with Patient 12/11/21 1209     (approximate)   History   Chief Complaint Dizziness   HPI  Bill Hammond is a 46 y.o. male with past medical history of hypertension and alcohol abuse who presents to the ED complaining of dizziness.  Patient reports that he has been feeling dizzy and lightheaded intermittently for the past couple of days.  This is occasionally been associated with throbbing headache but he currently denies any headache.  He additionally states that he has had some pain in the left upper quadrant of his abdomen, but also states this has resolved.  He denies any chest pain, shortness of breath, fevers, cough, nausea, vomiting, dysuria, pain or swelling in his legs.  He does state that his blood pressure has been high recently and that he has been out of his blood pressure medications for the past couple of weeks.     Physical Exam   Triage Vital Signs: ED Triage Vitals  Enc Vitals Group     BP 12/11/21 1015 (!) 220/129     Pulse Rate 12/11/21 1015 (!) 101     Resp 12/11/21 1015 16     Temp 12/11/21 1015 98.9 F (37.2 C)     Temp Source 12/11/21 1015 Oral     SpO2 12/11/21 1015 96 %     Weight 12/11/21 1017 (!) 321 lb (145.6 kg)     Height 12/11/21 1017 6' (1.829 m)     Head Circumference --      Peak Flow --      Pain Score 12/11/21 1021 5     Pain Loc --      Pain Edu? --      Excl. in GC? --     Most recent vital signs: Vitals:   12/11/21 1251 12/11/21 1437  BP: (!) 199/128 (!) 214/123  Pulse: 88 86  Resp: 18 18  Temp:    SpO2: 98% 100%    Constitutional: Alert and oriented. Eyes: Conjunctivae are normal. Head: Atraumatic. Nose: No congestion/rhinnorhea. Mouth/Throat: Mucous membranes are moist.  Cardiovascular: Normal rate, regular rhythm. Grossly normal heart sounds.  2+ radial pulses bilaterally. Respiratory: Normal respiratory effort.  No  retractions. Lungs CTAB. Gastrointestinal: Soft and nontender. No distention. Musculoskeletal: No lower extremity tenderness nor edema.  Neurologic:  Normal speech and language. No gross focal neurologic deficits are appreciated.    ED Results / Procedures / Treatments   Labs (all labs ordered are listed, but only abnormal results are displayed) Labs Reviewed  BASIC METABOLIC PANEL - Abnormal; Notable for the following components:      Result Value   CO2 21 (*)    Glucose, Bld 107 (*)    Calcium 8.5 (*)    All other components within normal limits  URINALYSIS, ROUTINE W REFLEX MICROSCOPIC - Abnormal; Notable for the following components:   Hgb urine dipstick SMALL (*)    Protein, ur TRACE (*)    Leukocytes,Ua TRACE (*)    All other components within normal limits  URINALYSIS, MICROSCOPIC (REFLEX) - Abnormal; Notable for the following components:   Bacteria, UA FEW (*)    All other components within normal limits  CBC  CBG MONITORING, ED     EKG  ED ECG REPORT I, Chesley Noon, the attending physician, personally viewed and interpreted this ECG.   Date: 12/11/2021  EKG Time: 10:22  Rate:  98  Rhythm: normal sinus rhythm  Axis: Normal  Intervals: Prolonged QT  ST&T Change: None  RADIOLOGY CT head reviewed and interpreted by me with no hemorrhage or midline shift.  PROCEDURES:  Critical Care performed: No  Procedures   MEDICATIONS ORDERED IN ED: Medications  prochlorperazine (COMPAZINE) tablet 10 mg (10 mg Oral Given 12/11/21 1437)  lisinopril (ZESTRIL) tablet 10 mg (10 mg Oral Given 12/11/21 1340)  hydrochlorothiazide (HYDRODIURIL) tablet 12.5 mg (12.5 mg Oral Given 12/11/21 1340)     IMPRESSION / MDM / ASSESSMENT AND PLAN / ED COURSE  I reviewed the triage vital signs and the nursing notes.                              46 y.o. male with past medical history of hypertension and alcohol abuse who presents to the ED complaining of intermittent dizziness,  headache, and abdominal pain for the past couple of days along with elevated blood pressure.  Patient's presentation is most consistent with acute presentation with potential threat to life or bodily function.  Differential diagnosis includes, but is not limited to, stroke, TIA, SAH, dehydration, electrolyte abnormality, anemia, AKI, gastritis, pancreatitis, hepatitis, cholecystitis, kidney stone.  Patient well-appearing and in no acute distress, vital signs are remarkable for elevated blood pressure but otherwise reassuring.  Patient states that his abdominal pain has resolved and he has a benign abdominal exam at this time.  He also states that his headache is improving but he does feel slightly nauseous.  He reports feeling dizzy but has no focal neurologic deficits on exam, CT head is negative for acute process.  EKG shows no evidence of arrhythmia or ischemia, labs are also reassuring with no significant anemia, leukocytosis, electrolyte abnormality, or AKI.  LFTs and lipase are within normal limits.  No evidence of hypertensive emergency at this time, patient apparently with chronic hypertension with BP elevated today due to medication noncompliance.  We will give initial dose of his home blood pressure medications and he is appropriate for discharge home with PCP follow-up.  He was counseled to return to the ED for new or worsening symptoms, patient agrees with plan.      FINAL CLINICAL IMPRESSION(S) / ED DIAGNOSES   Final diagnoses:  Dizziness  Primary hypertension     Rx / DC Orders   ED Discharge Orders          Ordered    lisinopril-hydrochlorothiazide (ZESTORETIC) 10-12.5 MG tablet  Daily        12/11/21 1517             Note:  This document was prepared using Dragon voice recognition software and may include unintentional dictation errors.   Chesley Noon, MD 12/11/21 1537

## 2021-12-11 NOTE — ED Notes (Signed)
Pt transported to CT ?

## 2022-03-13 ENCOUNTER — Inpatient Hospital Stay
Admission: EM | Admit: 2022-03-13 | Discharge: 2022-03-15 | DRG: 305 | Disposition: A | Discharge status: Home / Self Care | Payer: Self-pay | Attending: Internal Medicine | Admitting: Internal Medicine

## 2022-03-13 ENCOUNTER — Inpatient Hospital Stay: Payer: Self-pay

## 2022-03-13 ENCOUNTER — Other Ambulatory Visit: Payer: Self-pay

## 2022-03-13 ENCOUNTER — Emergency Department: Payer: Self-pay

## 2022-03-13 ENCOUNTER — Encounter: Payer: Self-pay | Admitting: Emergency Medicine

## 2022-03-13 DIAGNOSIS — T465X6A Underdosing of other antihypertensive drugs, initial encounter: Secondary | ICD-10-CM | POA: Diagnosis present

## 2022-03-13 DIAGNOSIS — Z91128 Patient's intentional underdosing of medication regimen for other reason: Secondary | ICD-10-CM

## 2022-03-13 DIAGNOSIS — R42 Dizziness and giddiness: Principal | ICD-10-CM

## 2022-03-13 DIAGNOSIS — Z720 Tobacco use: Secondary | ICD-10-CM

## 2022-03-13 DIAGNOSIS — Z91148 Patient's other noncompliance with medication regimen for other reason: Secondary | ICD-10-CM

## 2022-03-13 DIAGNOSIS — I10 Essential (primary) hypertension: Secondary | ICD-10-CM | POA: Diagnosis present

## 2022-03-13 DIAGNOSIS — Z79899 Other long term (current) drug therapy: Secondary | ICD-10-CM

## 2022-03-13 DIAGNOSIS — F1010 Alcohol abuse, uncomplicated: Secondary | ICD-10-CM

## 2022-03-13 DIAGNOSIS — I160 Hypertensive urgency: Principal | ICD-10-CM

## 2022-03-13 DIAGNOSIS — Z1152 Encounter for screening for COVID-19: Secondary | ICD-10-CM

## 2022-03-13 DIAGNOSIS — Z6841 Body Mass Index (BMI) 40.0 and over, adult: Secondary | ICD-10-CM

## 2022-03-13 DIAGNOSIS — F17210 Nicotine dependence, cigarettes, uncomplicated: Secondary | ICD-10-CM | POA: Diagnosis present

## 2022-03-13 DIAGNOSIS — E66813 Obesity, class 3: Secondary | ICD-10-CM | POA: Diagnosis present

## 2022-03-13 HISTORY — DX: Tobacco use: Z72.0

## 2022-03-13 LAB — RESP PANEL BY RT-PCR (FLU A&B, COVID) ARPGX2
Influenza A by PCR: NEGATIVE
Influenza B by PCR: NEGATIVE
SARS Coronavirus 2 by RT PCR: NEGATIVE

## 2022-03-13 LAB — URINALYSIS, ROUTINE W REFLEX MICROSCOPIC
Bacteria, UA: NONE SEEN
Bilirubin Urine: NEGATIVE
Glucose, UA: NEGATIVE mg/dL
Ketones, ur: NEGATIVE mg/dL
Leukocytes,Ua: NEGATIVE
Nitrite: NEGATIVE
Protein, ur: NEGATIVE mg/dL
Specific Gravity, Urine: 1.009 (ref 1.005–1.030)
WBC, UA: NONE SEEN WBC/hpf (ref 0–5)
pH: 8 (ref 5.0–8.0)

## 2022-03-13 LAB — COMPREHENSIVE METABOLIC PANEL
ALT: 15 U/L (ref 0–44)
AST: 26 U/L (ref 15–41)
Albumin: 3.8 g/dL (ref 3.5–5.0)
Alkaline Phosphatase: 75 U/L (ref 38–126)
Anion gap: 10 (ref 5–15)
BUN: 11 mg/dL (ref 6–20)
CO2: 20 mmol/L — ABNORMAL LOW (ref 22–32)
Calcium: 8.9 mg/dL (ref 8.9–10.3)
Chloride: 106 mmol/L (ref 98–111)
Creatinine, Ser: 0.84 mg/dL (ref 0.61–1.24)
GFR, Estimated: >60 mL/min (ref >60)
Glucose, Bld: 122 mg/dL — ABNORMAL HIGH (ref 70–99)
Potassium: 3.8 mmol/L (ref 3.5–5.1)
Sodium: 136 mmol/L (ref 135–145)
Total Bilirubin: 0.5 mg/dL (ref 0.3–1.2)
Total Protein: 7.7 g/dL (ref 6.5–8.1)

## 2022-03-13 LAB — BRAIN NATRIURETIC PEPTIDE: B Natriuretic Peptide: 213.3 pg/mL — ABNORMAL HIGH (ref 0.0–100.0)

## 2022-03-13 LAB — URINE DRUG SCREEN, QUALITATIVE (ARMC ONLY)
Amphetamines, Ur Screen: NOT DETECTED
Barbiturates, Ur Screen: NOT DETECTED
Benzodiazepine, Ur Scrn: NOT DETECTED
Cannabinoid 50 Ng, Ur ~~LOC~~: POSITIVE — AB
Cocaine Metabolite,Ur ~~LOC~~: NOT DETECTED
MDMA (Ecstasy)Ur Screen: NOT DETECTED
Methadone Scn, Ur: NOT DETECTED
Opiate, Ur Screen: NOT DETECTED
Phencyclidine (PCP) Ur S: NOT DETECTED
Tricyclic, Ur Screen: NOT DETECTED

## 2022-03-13 LAB — CBC
HCT: 42.7 % (ref 39.0–52.0)
Hemoglobin: 13.7 g/dL (ref 13.0–17.0)
MCH: 30.6 pg (ref 26.0–34.0)
MCHC: 32.1 g/dL (ref 30.0–36.0)
MCV: 95.3 fL (ref 80.0–100.0)
Platelets: 323 10*3/uL (ref 150–400)
RBC: 4.48 MIL/uL (ref 4.22–5.81)
RDW: 13.3 % (ref 11.5–15.5)
WBC: 6.9 10*3/uL (ref 4.0–10.5)
nRBC: 0 % (ref 0.0–0.2)

## 2022-03-13 LAB — TROPONIN I (HIGH SENSITIVITY): Troponin I (High Sensitivity): 15 ng/L (ref <18)

## 2022-03-13 LAB — ETHANOL: Alcohol, Ethyl (B): <10 mg/dL (ref <10)

## 2022-03-13 MED ORDER — LISINOPRIL 10 MG PO TABS
10.0000 mg | ORAL_TABLET | Freq: Every day | ORAL | Status: DC
  Administered 2022-03-13: 10 mg via ORAL
  Filled 2022-03-13: qty 1

## 2022-03-13 MED ORDER — THIAMINE MONONITRATE 100 MG PO TABS
100.0000 mg | ORAL_TABLET | Freq: Every day | ORAL | Status: DC
  Administered 2022-03-13 – 2022-03-15 (×3): 100 mg via ORAL
  Filled 2022-03-13 (×3): qty 1

## 2022-03-13 MED ORDER — LORAZEPAM 2 MG/ML IJ SOLN
0.0000 mg | Freq: Four times a day (QID) | INTRAMUSCULAR | Status: DC
  Administered 2022-03-13: 1 mg via INTRAVENOUS
  Filled 2022-03-13: qty 1

## 2022-03-13 MED ORDER — LORAZEPAM 1 MG PO TABS: 1.0000 mg | ORAL_TABLET | ORAL | Status: DC | PRN

## 2022-03-13 MED ORDER — NICOTINE 21 MG/24HR TD PT24
21.0000 mg | MEDICATED_PATCH | Freq: Every day | TRANSDERMAL | Status: DC
  Administered 2022-03-13 – 2022-03-14 (×2): 21 mg via TRANSDERMAL
  Filled 2022-03-13 (×2): qty 1

## 2022-03-13 MED ORDER — NICOTINE 21 MG/24HR TD PT24
21.0000 mg | MEDICATED_PATCH | Freq: Every day | TRANSDERMAL | Status: DC
  Administered 2022-03-13: 21 mg via TRANSDERMAL
  Filled 2022-03-13: qty 1

## 2022-03-13 MED ORDER — FOLIC ACID 1 MG PO TABS
1.0000 mg | ORAL_TABLET | Freq: Every day | ORAL | Status: DC
  Administered 2022-03-13 – 2022-03-15 (×3): 1 mg via ORAL
  Filled 2022-03-13 (×4): qty 1

## 2022-03-13 MED ORDER — LISINOPRIL-HYDROCHLOROTHIAZIDE 10-12.5 MG PO TABS: 1.0000 | ORAL_TABLET | Freq: Every day | ORAL | Status: DC

## 2022-03-13 MED ORDER — THIAMINE HCL 100 MG/ML IJ SOLN: 100.0000 mg | Freq: Every day | INTRAMUSCULAR | Status: DC

## 2022-03-13 MED ORDER — ALBUTEROL SULFATE (2.5 MG/3ML) 0.083% IN NEBU: 3.0000 mL | INHALATION_SOLUTION | RESPIRATORY_TRACT | Status: DC | PRN

## 2022-03-13 MED ORDER — ONDANSETRON 4 MG PO TBDP
4.0000 mg | ORAL_TABLET | Freq: Once | ORAL | Status: DC
  Filled 2022-03-13: qty 1

## 2022-03-13 MED ORDER — NICARDIPINE HCL IN NACL 20-0.86 MG/200ML-% IV SOLN
3.0000 mg/h | INTRAVENOUS | Status: DC
  Administered 2022-03-13: 2.5 mg/h via INTRAVENOUS
  Administered 2022-03-13: 7.5 mg/h via INTRAVENOUS
  Administered 2022-03-13 (×2): 5 mg/h via INTRAVENOUS
  Administered 2022-03-14: 2.5 mg/h via INTRAVENOUS
  Filled 2022-03-13 (×6): qty 200

## 2022-03-13 MED ORDER — SODIUM CHLORIDE 0.9 % IV SOLN
12.5000 mg | Freq: Once | INTRAVENOUS | Status: AC
  Administered 2022-03-13: 12.5 mg via INTRAVENOUS
  Filled 2022-03-13: qty 0.5

## 2022-03-13 MED ORDER — ENOXAPARIN SODIUM 80 MG/0.8ML IJ SOSY
0.5000 mg/kg | PREFILLED_SYRINGE | INTRAMUSCULAR | Status: DC
  Administered 2022-03-13: 72.5 mg via SUBCUTANEOUS
  Filled 2022-03-13 (×2): qty 0.72

## 2022-03-13 MED ORDER — HYDROCHLOROTHIAZIDE 12.5 MG PO TABS
12.5000 mg | ORAL_TABLET | Freq: Every day | ORAL | Status: DC
  Administered 2022-03-13: 12.5 mg via ORAL
  Filled 2022-03-13: qty 1

## 2022-03-13 MED ORDER — THIAMINE HCL 100 MG PO TABS
100.0000 mg | ORAL_TABLET | Freq: Every day | ORAL | Status: DC
  Filled 2022-03-13: qty 1

## 2022-03-13 MED ORDER — LORAZEPAM 2 MG/ML IJ SOLN: 1.0000 mg | INTRAMUSCULAR | Status: DC | PRN

## 2022-03-13 MED ORDER — ACETAMINOPHEN 325 MG PO TABS
650.0000 mg | ORAL_TABLET | Freq: Four times a day (QID) | ORAL | Status: DC | PRN
  Administered 2022-03-15: 650 mg via ORAL
  Filled 2022-03-13: qty 2

## 2022-03-13 MED ORDER — LORAZEPAM 2 MG/ML IJ SOLN: 0.0000 mg | Freq: Four times a day (QID) | INTRAMUSCULAR | Status: AC

## 2022-03-13 MED ORDER — DIPHENHYDRAMINE HCL 50 MG/ML IJ SOLN: 12.5000 mg | Freq: Three times a day (TID) | INTRAMUSCULAR | Status: DC | PRN

## 2022-03-13 MED ORDER — HYDRALAZINE HCL 20 MG/ML IJ SOLN
5.0000 mg | INTRAMUSCULAR | Status: DC | PRN
  Administered 2022-03-14: 5 mg via INTRAVENOUS
  Filled 2022-03-13: qty 1

## 2022-03-13 MED ORDER — LORAZEPAM 2 MG/ML IJ SOLN: 0.0000 mg | Freq: Two times a day (BID) | INTRAMUSCULAR | Status: DC

## 2022-03-13 MED ORDER — ADULT MULTIVITAMIN W/MINERALS CH
1.0000 | ORAL_TABLET | Freq: Every day | ORAL | Status: DC
  Administered 2022-03-13 – 2022-03-15 (×3): 1 via ORAL
  Filled 2022-03-13 (×2): qty 1

## 2022-03-13 MED ORDER — MECLIZINE HCL 25 MG PO TABS
25.0000 mg | ORAL_TABLET | Freq: Three times a day (TID) | ORAL | Status: DC | PRN
  Administered 2022-03-13 – 2022-03-14 (×3): 25 mg via ORAL
  Filled 2022-03-13 (×4): qty 1

## 2022-03-13 NOTE — Assessment & Plan Note (Signed)
  BMI= 43.40   and BW= 145.2 Kg -Diet and exercise.   -Encouraged to lose weight.

## 2022-03-13 NOTE — ED Notes (Signed)
MD messaged if pt could come off of cardene drip for MRI - MD replied that pt can come off. Pt transported to MRI at this time.

## 2022-03-13 NOTE — ED Provider Triage Note (Signed)
Emergency Medicine Provider Triage Evaluation Note  Bill Hammond , a 46 y.o. male  was evaluated in triage.  Pt complains of dizziness, nausea. Awoke to use the restroom around 3 AM and was the room spinning if he opens his eyes.  Last took his blood pressure medicine 2 weeks ago.  Daily drinker, no history of DTs; last drink last evening.  Endorses marijuana use, denies cocaine use.  Review of Systems  Positive: Dizziness, nausea Negative: Chest pain  Physical Exam  BP (!) 225/142 (BP Location: Left Arm)   Pulse 88   Temp 97.6 F (36.4 C) (Oral)   Resp 20   Ht 6' (1.829 m)   Wt (!) 145.2 kg   SpO2 100%   BMI 43.40 kg/m  Gen:   Awake, mild distress   Resp:  Normal effort  MSK:   Moves extremities without difficulty  Other:  Dry heaving.  No gross neurological deficits.  Medical Decision Making  Medically screening exam initiated at 4:46 AM.  Appropriate orders placed.  Bill Hammond was informed that the remainder of the evaluation will be completed by another provider, this initial triage assessment does not replace that evaluation, and the importance of remaining in the ED until their evaluation is complete.  46 year old male presenting with dizziness and nausea.  Noncompliant with antihypertensives.  Arrives with extremely high blood pressure.  Will be taken emergently to CT scan and placed in treatment room.   Paulette Blanch, MD 03/13/22 [phone]

## 2022-03-13 NOTE — ED Triage Notes (Addendum)
Pt to triage via w/c with no distress noted; st awoke this morning with dizziness and nausea; denies any hx of same; st no BP med in "couple weeks"

## 2022-03-13 NOTE — TOC Initial Note (Signed)
Transition of Care [facility]) - Initial/Assessment Note    Patient Details  Name: Bill Hammond MRN: [id number] Date of Birth: 01-09-1976  [facility]) CM/SW Contact:    Alberteen Sam, LCSW Phone Number: 03/13/2022, 10:07 AM  Clinical Narrative:                  CSW went to patient room however pre family patient in MRI. Spoke with Rolla Plate who reports patient has no PCP and no insurance. Agreeable to receive [place] left at bedside as well as substance abuse resources.   No further needs identified at this time.   Expected Discharge Plan: Home/Self Care Barriers to Discharge: Continued Medical Work up   Patient Goals and CMS Choice Patient states their goals for this hospitalization and ongoing recovery are:: to go home CMS Medicare.gov Compare Post Acute Care list provided to:: Patient Choice offered to / list presented to : Patient  Expected Discharge Plan and Services Expected Discharge Plan: Home/Self Care                                              Prior Living Arrangements/Services                       Activities of Daily Living      Permission Sought/Granted                  Emotional Assessment              Admission diagnosis:  Hypertensive urgency [I16.0] Patient Active Problem List   Diagnosis Date Noted   Hypertensive urgency 03/13/2022   Tobacco abuse 03/13/2022   Obesity, Class III, BMI 40-49.9 (morbid obesity) ([place]) 03/13/2022   ETOH abuse    Dizziness    Priapism, drug-induced    Priapism    PCP:  Pcp, No Pharmacy:   [facility] [address], [place] [address] Phone: [phone] Fax: [phone]     Social Determinants of Health (SDOH) Interventions    Readmission Risk Interventions     No data to display

## 2022-03-13 NOTE — Assessment & Plan Note (Signed)
Blood pressure is up to 233/145, which improved to SBP of 170s.  This is most likely due to medication noncompliance.   -Will admit to PCU as inpt -Continue cardene gtt, with goal of bp reduction by about ~30% in first several hours - Frequent neuro check   - resume home Zestoretic

## 2022-03-13 NOTE — Assessment & Plan Note (Signed)
-  Did counseling about importance of quitting alcohol use -CIWA protocol 

## 2022-03-13 NOTE — H&P (Signed)
History and Physical    Bill Hammond BZJ:[id number] DOB: 05-29-1975 DOA: 03/13/2022  Referring MD/NP/PA:   PCP: Pcp, No   Patient coming from:  The patient is coming from home.  At baseline, pt is independent for most of ADL.        Chief Complaint: dizziness  HPI: Bill Hammond is a 46 y.o. male with medical history significant of HTN, alcohol and tobacco abuse, morbid obesity with a BMI 43.40, drug-induced priapism, who presents with dizziness.  Patient states that he did not take his blood pressure medications for about 2 weeks.  He started feeling dizzy this morning at about 3 AM.  He reports feeling room spinning around him.  No ear ringing or hearing loss.  Denies unilateral numbness or tinglings in extremities.  No facial droop or slurred speech.  It is associated with nausea and few times of nonbilious nonbloody vomiting.  No abdominal pain or diarrhea.  Patient denies chest pain, cough, shortness breath.  No fever or chills.  Denies symptoms of UTI.  Patient was found to have elevated blood pressure 233/145, Cardene drip was started, blood pressure improved to SBP of 170s in ED.  Data reviewed independently and ED Course: pt was found to have WBC 6.9, troponin level 15, BMP 213, negative COVID PCR, alcohol level less than 10, GFR> 60.  Temperature normal, heart rate 102, RR 29.  Chest x-ray negative.  CT of head is negative for acute intracranial abnormalities.  Patient is admitted to PCU as inpatient   EKG: I have personally reviewed.  Sinus rhythm, QTc 497, LAE, poor R wave progression  Review of Systems:   General: no fevers, chills, no body weight gain, has poor appetite, has fatigue HEENT: no blurry vision, hearing changes or sore throat Respiratory: no dyspnea, coughing, wheezing CV: no chest pain, no palpitations GI: has nausea, vomiting, no abdominal pain, diarrhea, constipation GU: no dysuria, burning on urination, increased urinary frequency, hematuria  Ext: no  leg edema Neuro: no unilateral weakness, numbness, or tingling, no vision change or hearing loss. Has dizziness Skin: no rash, no skin tear. MSK: No muscle spasm, no deformity, no limitation of range of movement in spin Heme: No easy bruising.  Travel history: No recent long distant travel.   Allergy: No Known Allergies  Past Medical History:  Diagnosis Date   ETOH abuse    Hypertension    Priapism    Renal disorder    kidney failure   Tobacco abuse     Past Surgical History:  Procedure Laterality Date   PRIAPISM REPAIR N/A 05/28/2018   Procedure: PENILE SHUNT;  Surgeon: Malen Gauze, MD;  Location: [facility] ORS;  Service: Urology;  Laterality: N/A;   PRIAPISM REPAIR N/A 06/05/2018   Procedure: DISTAL PENILE SHUNT (TSHUNT);  Surgeon: Sondra Come, MD;  Location: [facility] ORS;  Service: Urology;  Laterality: N/A;    Social History:  reports that he has been smoking cigarettes. He has been smoking an average of 1 pack per day. He has never used smokeless tobacco. He reports current alcohol use. He reports that he does not use drugs.  Family History: No family history on file.   Prior to Admission medications   Medication Sig Start Date End Date Taking? Authorizing Provider  Acetaminophen-Codeine 300-30 MG tablet Take 1 tablet by mouth every 6 (six) hours as needed for pain. Patient not taking: Reported on 12/11/2021 08/02/19   Tommi Rumps, PA-C  albuterol (VENTOLIN HFA) 108 (  90 Base) MCG/ACT inhaler Inhale 2 puffs into the lungs every 6 (six) hours as needed for wheezing or shortness of breath. Patient not taking: Reported on 12/11/2021 12/31/18   Jannifer Rodney A, FNP  ibuprofen (ADVIL,MOTRIN) 200 MG tablet Take 200 mg by mouth every 6 (six) hours as needed.    [provider]  lisinopril-hydrochlorothiazide (ZESTORETIC) 10-12.5 MG tablet Take 1 tablet by mouth daily. 12/11/21 03/11/22  Chesley Noon, MD  amLODipine (NORVASC) 5 MG tablet Take 1 tablet (5 mg total) by  mouth daily. 03/21/19 08/14/20  Palumbo, April, MD    Physical Exam: Vitals:   03/13/22 0615 03/13/22 0630 03/13/22 0645 03/13/22 0700  BP: (!) 166/106 (!) 158/102 (!) 194/102 (!) 176/96  Pulse: 97 100 (!) 109 (!) 102  Resp: (!) 29 18 (!) 21 19  Temp:      TempSrc:      SpO2: 99% 100% 97% 98%  Weight:      Height:       General: Not in acute distress HEENT:       Eyes: PERRL, EOMI, no scleral icterus.       ENT: No discharge from the ears and nose, no pharynx injection, no tonsillar enlargement.        Neck: No JVD, no bruit, no mass felt. Heme: No neck lymph node enlargement. Cardiac: S1/S2, RRR, No murmurs, No gallops or rubs. Respiratory: No rales, wheezing, rhonchi or rubs. GI: Soft, nondistended, nontender, no rebound pain, no organomegaly, BS present. GU: No hematuria Ext: No pitting leg edema bilaterally. 1+DP/PT pulse bilaterally. Musculoskeletal: No joint deformities, No joint redness or warmth, no limitation of ROM in spin. Skin: No rashes.  Neuro: Alert, oriented X3, cranial nerves II-XII grossly intact, moves all extremities normally. Muscle strength 5/5 in all extremities, sensation to light touch intact.  Psych: Patient is not psychotic, no suicidal or hemocidal ideation.  Labs on Admission: I have personally reviewed following labs and imaging studies  CBC: Recent Labs  Lab 03/13/22 0519  WBC 6.9  HGB 13.7  HCT 42.7  MCV 95.3  PLT 323   Basic Metabolic Panel: Recent Labs  Lab 03/13/22 0519  NA 136  K 3.8  CL 106  CO2 20*  GLUCOSE 122*  BUN 11  CREATININE 0.84  CALCIUM 8.9   GFR: Estimated Creatinine Clearance: 166 mL/min (by C-G formula based on SCr of 0.84 mg/dL). Liver Function Tests: Recent Labs  Lab 03/13/22 0519  AST 26  ALT 15  ALKPHOS 75  BILITOT 0.5  PROT 7.7  ALBUMIN 3.8   No results for input(s): "LIPASE", "AMYLASE" in the last 168 hours. No results for input(s): "AMMONIA" in the last 168 hours. Coagulation Profile: No  results for input(s): "INR", "PROTIME" in the last 168 hours. Cardiac Enzymes: No results for input(s): "CKTOTAL", "CKMB", "CKMBINDEX", "TROPONINI" in the last 168 hours. BNP (last 3 results) No results for input(s): "PROBNP" in the last 8760 hours. HbA1C: No results for input(s): "HGBA1C" in the last 72 hours. CBG: No results for input(s): "GLUCAP" in the last 168 hours. Lipid Profile: No results for input(s): "CHOL", "HDL", "LDLCALC", "TRIG", "CHOLHDL", "LDLDIRECT" in the last 72 hours. Thyroid Function Tests: No results for input(s): "TSH", "T4TOTAL", "FREET4", "T3FREE", "THYROIDAB" in the last 72 hours. Anemia Panel: No results for input(s): "VITAMINB12", "FOLATE", "FERRITIN", "TIBC", "IRON", "RETICCTPCT" in the last 72 hours. Urine analysis:    Component Value Date/Time   COLORURINE YELLOW 12/11/2021 1251   APPEARANCEUR CLEAR 12/11/2021 1251  LABSPEC 1.025 12/11/2021 1251   PHURINE 5.5 12/11/2021 1251   GLUCOSEU NEGATIVE 12/11/2021 1251   HGBUR SMALL (A) 12/11/2021 1251   BILIRUBINUR NEGATIVE 12/11/2021 1251   KETONESUR NEGATIVE 12/11/2021 1251   PROTEINUR TRACE (A) 12/11/2021 1251   NITRITE NEGATIVE 12/11/2021 1251   LEUKOCYTESUR TRACE (A) 12/11/2021 1251   Sepsis Labs: @LABRCNTIP (procalcitonin:4,lacticidven:4) ) Recent Results (from the past 240 hour(s))  Resp Panel by RT-PCR (Flu A&B, Covid) Anterior Nasal Swab     Status: None   Collection Time: 03/13/22  5:18 AM   Specimen: Anterior Nasal Swab  Result Value Ref Range Status   SARS Coronavirus 2 by RT PCR NEGATIVE NEGATIVE Final    Comment: (NOTE) SARS-CoV-2 target nucleic acids are NOT DETECTED.  The SARS-CoV-2 RNA is generally detectable in upper respiratory specimens during the acute phase of infection. The lowest concentration of SARS-CoV-2 viral copies this assay can detect is 138 copies/mL. A negative result does not preclude SARS-Cov-2 infection and should not be used as the sole basis for treatment  or other patient management decisions. A negative result may occur with  improper specimen collection/handling, submission of specimen other than nasopharyngeal swab, presence of viral mutation(s) within the areas targeted by this assay, and inadequate number of viral copies(<138 copies/mL). A negative result must be combined with clinical observations, patient history, and epidemiological information. The expected result is Negative.  Fact Sheet for Patients:  BloggerCourse.comhttps://www.fda.gov/media/152166/download  Fact Sheet for Healthcare Providers:  SeriousBroker.ithttps://www.fda.gov/media/152162/download  This test is no t yet approved or cleared by the [place] FDA and  has been authorized for detection and/or diagnosis of SARS-CoV-2 by FDA under an Emergency Use Authorization (EUA). This EUA will remain  in effect (meaning this test can be used) for the duration of the COVID-19 declaration under Section 564(b)(1) of the Act, 21 U.S.C.section 360bbb-3(b)(1), unless the authorization is terminated  or revoked sooner.       Influenza A by PCR NEGATIVE NEGATIVE Final   Influenza B by PCR NEGATIVE NEGATIVE Final    Comment: (NOTE) The Xpert Xpress SARS-CoV-2/FLU/RSV plus assay is intended as an aid in the diagnosis of influenza from Nasopharyngeal swab specimens and should not be used as a sole basis for treatment. Nasal washings and aspirates are unacceptable for Xpert Xpress SARS-CoV-2/FLU/RSV testing.  Fact Sheet for Patients: BloggerCourse.comhttps://www.fda.gov/media/152166/download  Fact Sheet for Healthcare Providers: SeriousBroker.ithttps://www.fda.gov/media/152162/download  This test is not yet approved or cleared by the [place] FDA and has been authorized for detection and/or diagnosis of SARS-CoV-2 by FDA under an Emergency Use Authorization (EUA). This EUA will remain in effect (meaning this test can be used) for the duration of the COVID-19 declaration under Section 564(b)(1) of the Act, 21 U.S.C. section  360bbb-3(b)(1), unless the authorization is terminated or revoked.  Performed at [facility], [address] [place], [place] [address]      Radiological Exams on Admission: [facility]  Result Date: 03/13/2022 CLINICAL DATA:  46 year old male with history of dizziness. Emesis. High blood pressure. Nausea. EXAM: PORTABLE CHEST 1 VIEW COMPARISON:  No priors. FINDINGS: Lung volumes are normal. No consolidative airspace disease. No pleural effusions. No pneumothorax. No pulmonary nodule or mass noted. Pulmonary vasculature and the cardiomediastinal silhouette are within normal limits. IMPRESSION: No radiographic evidence of acute cardiopulmonary disease. Electronically Signed   By: Trudie Reedaniel  Entrikin M.D.   On: 03/13/2022 05:25   CT Head Wo Contrast  Result Date: 03/13/2022 CLINICAL DATA:  46 year old male with history of  dizziness. EXAM: CT HEAD WITHOUT CONTRAST TECHNIQUE: Contiguous axial images were obtained from the base of the skull through the vertex without intravenous contrast. RADIATION DOSE REDUCTION: This exam was performed according to the departmental dose-optimization program which includes automated exposure control, adjustment of the mA and/or kV according to patient size and/or use of iterative reconstruction technique. COMPARISON:  Head CT 12/11/2021. FINDINGS: Brain: No evidence of acute infarction, hemorrhage, hydrocephalus, extra-axial collection or mass lesion/mass effect. Vascular: No hyperdense vessel or unexpected calcification. Skull: Normal. Negative for fracture or focal lesion. Sinuses/Orbits: No acute finding. Other: None. IMPRESSION: 1. No acute intracranial abnormalities. The appearance of the brain is normal. Electronically Signed   By: Trudie Reed M.D.   On: 03/13/2022 05:24      Assessment/Plan Principal Problem:   Hypertensive urgency Active Problems:   Dizziness   ETOH abuse   Tobacco abuse   Obesity, Class III, BMI 40-49.9 (morbid  obesity) (HCC)   Assessment and Plan: * Hypertensive urgency Blood pressure is up to 233/145, which improved to SBP of 170s.  This is most likely due to medication noncompliance.   -Will admit to PCU as inpt -Continue cardene gtt, with goal of bp reduction by about ~30% in first several hours - Frequent neuro check   - resume home Zestoretic  Dizziness Etiology is not clear.  CT head negative.  Potential differential diagnosis include hypertensive encephalopathy and posterior circulation stroke. -Fall precaution -Follow-up MRI for brain to rule out stroke -Frequent neurochecks -As needed meclizine -Check orthostatic vital sign  ETOH abuse - Did counseling about importance of quitting alcohol use -CIWA protocol  Tobacco abuse - Did counseling about importance of quitting smoking -Nicotine patch  Obesity, Class III, BMI 40-49.9 (morbid obesity) (HCC)  BMI= 43.40   and BW= 145.2 Kg -Diet and exercise.   -Encouraged to lose weight.           DVT ppx:  SQ Lovenox  Code Status: Full code  Family Communication: not done, no family member is at bed side.     Disposition Plan:  Anticipate discharge back to previous environment  Consults called:  none  Admission status and Level of care: Progressive:    as inpt        Dispo: The patient is from: Home              Anticipated d/c is to: Home              Anticipated d/c date is: 2 days              Patient currently is not medically stable to d/c.    Severity of Illness:  The appropriate patient status for this patient is INPATIENT. Inpatient status is judged to be reasonable and necessary in order to provide the required intensity of service to ensure the patient's safety. The patient's presenting symptoms, physical exam findings, and initial radiographic and laboratory data in the context of their chronic comorbidities is felt to place them at high risk for further clinical deterioration. Furthermore, it is  not anticipated that the patient will be medically stable for discharge from the hospital within 2 midnights of admission.   * I certify that at the point of admission it is my clinical judgment that the patient will require inpatient hospital care spanning beyond 2 midnights from the point of admission due to high intensity of service, high risk for further deterioration and high frequency of surveillance required.*  Date of Service 03/13/2022    Ivor Costa [facility]   If 7PM-7AM, please contact night-coverage [email or web address] 03/13/2022, 8:40 AM

## 2022-03-13 NOTE — ED Provider Notes (Signed)
[facility] Provider Note    Event Date/Time   First MD Initiated Contact with Patient 03/13/22 [phone]     (approximate)   History   Dizziness   HPI  Bill [place] is a 46 y.o. male who presents to the ED from home with a chief complaint of nausea and dizziness.  Patient awoke to use the restroom around 3 AM and felt dizzy like the room was spinning.  This improves slightly when he closes his eyes and does not move his head from side to side.  Symptoms associated with nausea.  Admits he has not taken any of his antihypertensives over the past 2 weeks.  Denies stimulant drug use; endorses marijuana use, last used last evening.  He is also a daily drinker with last drink yesterday evening.  Denies history of DTs.  Denies chest pain, shortness of breath, abdominal pain, vomiting.     Past Medical History   Past Medical History:  Diagnosis Date   ETOH abuse    Hypertension    Priapism    Renal disorder    kidney failure     Active Problem List   Patient Active Problem List   Diagnosis Date Noted   Hypertensive urgency 03/13/2022   Priapism, drug-induced    Priapism      Past Surgical History   Past Surgical History:  Procedure Laterality Date   PRIAPISM REPAIR N/A 05/28/2018   Procedure: PENILE SHUNT;  Surgeon: Malen Gauze, MD;  Location: [facility] ORS;  Service: Urology;  Laterality: N/A;   PRIAPISM REPAIR N/A 06/05/2018   Procedure: DISTAL PENILE SHUNT (TSHUNT);  Surgeon: Sondra Come, MD;  Location: [facility] ORS;  Service: Urology;  Laterality: N/A;     Home Medications   Prior to Admission medications   Medication Sig Start Date End Date Taking? Authorizing Provider  Acetaminophen-Codeine 300-30 MG tablet Take 1 tablet by mouth every 6 (six) hours as needed for pain. Patient not taking: Reported on 12/11/2021 08/02/19   Tommi Rumps, PA-C  albuterol (VENTOLIN HFA) 108 (90 Base) MCG/ACT inhaler Inhale 2 puffs into the lungs every 6  (six) hours as needed for wheezing or shortness of breath. Patient not taking: Reported on 12/11/2021 12/31/18   Jannifer Rodney A, FNP  ibuprofen (ADVIL,MOTRIN) 200 MG tablet Take 200 mg by mouth every 6 (six) hours as needed.    [provider]  lisinopril-hydrochlorothiazide (ZESTORETIC) 10-12.5 MG tablet Take 1 tablet by mouth daily. 12/11/21 03/11/22  Chesley Noon, MD  amLODipine (NORVASC) 5 MG tablet Take 1 tablet (5 mg total) by mouth daily. 03/21/19 08/14/20  Palumbo, April, MD     Allergies  Patient has no known allergies.   Family History  No family history on file.   Physical Exam  Triage Vital Signs: ED Triage Vitals  Enc Vitals Group     BP 03/13/22 0435 (!) 233/145     Pulse Rate 03/13/22 0435 88     Resp 03/13/22 0435 20     Temp 03/13/22 0435 97.6 F (36.4 C)     Temp Source 03/13/22 0435 Oral     SpO2 03/13/22 0436 100 %     Weight 03/13/22 0439 (!) 320 lb (145.2 kg)     Height 03/13/22 0439 6' (1.829 m)     Head Circumference --      Peak Flow --      Pain Score 03/13/22 0438 0     Pain Loc --  Pain Edu? --      Excl. in [place]? --     Updated Vital Signs: BP (!) 205/118   Pulse 99   Temp 97.6 F (36.4 C) (Oral)   Resp 16   Ht 6' (1.829 m)   Wt (!) 145.2 kg   SpO2 100%   BMI 43.40 kg/m    General: Awake, moderate distress.  Keeping eyes closed for comfort. CV:  RRR.  Good peripheral perfusion.  Resp:  Normal effort.  CTAB. Abd:  Nontender.  No distention.  Other:  Alert and oriented x3.  CN II-XII grossly intact.  5/5 motor strength and sensation all extremities. MAEx4.  Supple neck without meningismus.  No carotid bruits.   ED Results / Procedures / Treatments  Labs (all labs ordered are listed, but only abnormal results are displayed) Labs Reviewed  COMPREHENSIVE METABOLIC PANEL - Abnormal; Notable for the following components:      Result Value   CO2 20 (*)    Glucose, Bld 122 (*)    All other components within normal limits   RESP PANEL BY RT-PCR (FLU A&B, COVID) ARPGX2  CBC  ETHANOL  BRAIN NATRIURETIC PEPTIDE  URINALYSIS, ROUTINE W REFLEX MICROSCOPIC  URINE DRUG SCREEN, QUALITATIVE ([facility] ONLY)  TROPONIN I (HIGH SENSITIVITY)     EKG  ED ECG REPORT I, Clare Fennimore J, the attending physician, personally viewed and interpreted this ECG.   Date: 03/13/2022  EKG Time: 0442  Rate: 86  Rhythm: normal sinus rhythm  Axis: Normal  Intervals: QTc 512  ST&T Change: Nonspecific    RADIOLOGY I have independently visualized and interpreted patient's CT and chest x-ray as well as noted the radiology interpretation:  CT head: No ICH  Chest x-ray: No acute cardiopulmonary process  Official radiology report(s): DG Chest Port 1 View  Result Date: 03/13/2022 CLINICAL DATA:  46 year old male with history of dizziness. Emesis. High blood pressure. Nausea. EXAM: PORTABLE CHEST 1 VIEW COMPARISON:  No priors. FINDINGS: Lung volumes are normal. No consolidative airspace disease. No pleural effusions. No pneumothorax. No pulmonary nodule or mass noted. Pulmonary vasculature and the cardiomediastinal silhouette are within normal limits. IMPRESSION: No radiographic evidence of acute cardiopulmonary disease. Electronically Signed   By: Vinnie Langton M.D.   On: 03/13/2022 05:25   CT Head Wo Contrast  Result Date: 03/13/2022 CLINICAL DATA:  46 year old male with history of dizziness. EXAM: CT HEAD WITHOUT CONTRAST TECHNIQUE: Contiguous axial images were obtained from the base of the skull through the vertex without intravenous contrast. RADIATION DOSE REDUCTION: This exam was performed according to the departmental dose-optimization program which includes automated exposure control, adjustment of the mA and/or kV according to patient size and/or use of iterative reconstruction technique. COMPARISON:  Head CT 12/11/2021. FINDINGS: Brain: No evidence of acute infarction, hemorrhage, hydrocephalus, extra-axial collection or mass  lesion/mass effect. Vascular: No hyperdense vessel or unexpected calcification. Skull: Normal. Negative for fracture or focal lesion. Sinuses/Orbits: No acute finding. Other: None. IMPRESSION: 1. No acute intracranial abnormalities. The appearance of the brain is normal. Electronically Signed   By: Vinnie Langton M.D.   On: 03/13/2022 05:24     PROCEDURES:  Critical Care performed: Yes, see critical care procedure note(s)  CRITICAL CARE Performed by: Paulette Blanch   Total critical care time: 45 minutes  Critical care time was exclusive of separately billable procedures and treating other patients.  Critical care was necessary to treat or prevent imminent or life-threatening deterioration.  Critical care was time spent personally by  me on the following activities: development of treatment plan with patient and/or surrogate as well as nursing, discussions with consultants, evaluation of patient's response to treatment, examination of patient, obtaining history from patient or surrogate, ordering and performing treatments and interventions, ordering and review of laboratory studies, ordering and review of radiographic studies, pulse oximetry and re-evaluation of patient's condition.   Marland Kitchen1-3 Lead EKG Interpretation  Performed by: Paulette Blanch, MD Authorized by: Paulette Blanch, MD     Interpretation: normal     ECG rate:  85   ECG rate assessment: normal     Rhythm: sinus rhythm     Ectopy: none     Conduction: normal   Comments:     Patient placed on cardiac monitor to evaluate for arrhythmias  NIH Stroke Scale  Interval: Baseline Time: 6:01 AM Person Administering Scale: Aala Ransom J  Administer stroke scale items in the order listed. Record performance in each category after each subscale exam. Do not go back and change scores. Follow directions provided for each exam technique. Scores should reflect what the patient does, not what the clinician thinks the patient can do. The  clinician should record answers while administering the exam and work quickly. Except where indicated, the patient should not be coached (i.e., repeated requests to patient to make a special effort).   1a  Level of consciousness: 0=alert; keenly responsive  1b. LOC questions:  0=Performs both tasks correctly  1c. LOC commands: 0=Performs both tasks correctly  2.  Best Gaze: 0=normal  3.  Visual: 0=No visual loss  4. Facial Palsy: 0=Normal symmetric movement  5a.  Motor left arm: 0=No drift, limb holds 90 (or 45) degrees for full 10 seconds  5b.  Motor right arm: 0=No drift, limb holds 90 (or 45) degrees for full 10 seconds  6a. motor left leg: 0=No drift, limb holds 90 (or 45) degrees for full 10 seconds  6b  Motor right leg:  0=No drift, limb holds 90 (or 45) degrees for full 10 seconds  7. Limb Ataxia: 0=Absent  8.  Sensory: 0=Normal; no sensory loss  9. Best Language:  0=No aphasia, normal  10. Dysarthria: 0=Normal  11. Extinction and Inattention: 0=No abnormality  12. Distal motor function: 0=Normal   Total:   0     MEDICATIONS ORDERED IN ED: Medications  thiamine (VITAMIN B1) tablet 100 mg (has no administration in time range)  LORazepam (ATIVAN) injection 0-4 mg (1 mg Intravenous Given 03/13/22 0519)  nicardipine (CARDENE) 20mg  in 0.86% saline 249ml IV infusion (0.1 mg/ml) (7.5 mg/hr Intravenous Rate/Dose Change 03/13/22 0555)  promethazine (PHENERGAN) 12.5 mg in sodium chloride 0.9 % 50 mL IVPB (has no administration in time range)     IMPRESSION / MDM / ASSESSMENT AND PLAN / ED COURSE  I reviewed the triage vital signs and the nursing notes.                             46 year old male presenting with dizziness and nausea; extremely elevated blood pressure on triage.  Differential diagnosis includes but is not limited to CVA/TIA, ACS, vertigo, toxicological, metabolic etiologies, etc.  I have personally reviewed patient's records and note that patient was seen for  dizziness and hypertension in the ED on 12/11/2021.  Patient's presentation is most consistent with acute presentation with potential threat to life or bodily function.  The patient is on the cardiac monitor to evaluate for evidence of arrhythmia  and/or significant heart rate changes.  Patient sent urgently for CT head from triage.  Will obtain cardiac panel, chest x-ray.  Placed on CIWA protocol.  Administer IV antiemetic, blood pressure control.  Clinical Course as of 03/13/22 0601  Tue Mar 13, 2022  0511 Patient went to CT from triage and brought to treatment room #13.  Patient vomited in CT.  Nursing establishing IV line.  Have ordered nicardipine drip.  Anticipate hospitalization.  QTc looks prolonged; instead of giving Zofran will administer low-dose IV Phenergan for nausea/vomiting. [JS]  6378 CT head and chest x-ray unremarkable.  Awaiting lab work.  Nicardipine drip has been initiated. [JS]  0601 WBC 6.9, CMP unremarkable, EtOH negative.  Will consult hospitalist services for evaluation and admission. [JS]    Clinical Course User Index [JS] Paulette Blanch, MD     FINAL CLINICAL IMPRESSION(S) / ED DIAGNOSES   Final diagnoses:  Dizziness  Hypertensive urgency     Rx / DC Orders   ED Discharge Orders     None        Note:  This document was prepared using Dragon voice recognition software and may include unintentional dictation errors.   Paulette Blanch, MD 03/13/22 [phone]

## 2022-03-13 NOTE — ED Notes (Signed)
Pt on phone with MRI for screening questions  

## 2022-03-13 NOTE — Assessment & Plan Note (Signed)
-  Did counseling about importance of quitting smoking Nicotine patch 

## 2022-03-13 NOTE — ED Notes (Signed)
Family at bedside. 

## 2022-03-13 NOTE — Assessment & Plan Note (Signed)
Etiology is not clear.  CT head negative.  Potential differential diagnosis include hypertensive encephalopathy and posterior circulation stroke. -Fall precaution -Follow-up MRI for brain to rule out stroke -Frequent neurochecks -As needed meclizine -Check orthostatic vital sign

## 2022-03-14 MED ORDER — ENOXAPARIN SODIUM 80 MG/0.8ML IJ SOSY
0.5000 mg/kg | PREFILLED_SYRINGE | Freq: Every day | INTRAMUSCULAR | Status: DC
  Administered 2022-03-14: 65 mg via SUBCUTANEOUS

## 2022-03-14 MED ORDER — NICOTINE 21 MG/24HR TD PT24
21.0000 mg | MEDICATED_PATCH | Freq: Every day | TRANSDERMAL | Status: DC
  Administered 2022-03-14 – 2022-03-15 (×2): 21 mg via TRANSDERMAL
  Filled 2022-03-14 (×2): qty 1

## 2022-03-14 MED ORDER — AMLODIPINE BESYLATE 5 MG PO TABS
5.0000 mg | ORAL_TABLET | Freq: Every day | ORAL | Status: DC
  Administered 2022-03-14: 5 mg via ORAL
  Filled 2022-03-14: qty 1

## 2022-03-14 MED ORDER — HYDROCHLOROTHIAZIDE 25 MG PO TABS
25.0000 mg | ORAL_TABLET | Freq: Every day | ORAL | Status: DC
  Administered 2022-03-14 – 2022-03-15 (×2): 25 mg via ORAL
  Filled 2022-03-14 (×2): qty 1

## 2022-03-14 MED ORDER — LISINOPRIL 10 MG PO TABS
10.0000 mg | ORAL_TABLET | Freq: Every day | ORAL | Status: DC
  Administered 2022-03-14 – 2022-03-15 (×2): 10 mg via ORAL
  Filled 2022-03-14 (×2): qty 1

## 2022-03-14 NOTE — ED Notes (Signed)
Pt educated on three separate occasions about the importance of staying admitted. Pt states he just needs to go outside and he would like to smoke. He took his nicotine patch off and declined a new one. MD aware.
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
## 2022-03-14 NOTE — ED Notes (Signed)
Pt up and walking in room states some dizziness but no nausea.

## 2022-03-15 ENCOUNTER — Other Ambulatory Visit: Payer: Self-pay

## 2022-03-15 LAB — HIV ANTIBODY (ROUTINE TESTING W REFLEX): HIV Screen 4th Generation wRfx: NONREACTIVE

## 2022-03-15 MED ORDER — MECLIZINE HCL 25 MG PO TABS: 25.0000 mg | ORAL_TABLET | Freq: Three times a day (TID) | ORAL | 0 refills | Status: DC | PRN

## 2022-03-15 MED ORDER — FOLIC ACID 1 MG PO TABS: 1.0000 mg | ORAL_TABLET | Freq: Every day | ORAL | 0 refills | Status: DC

## 2022-03-15 MED ORDER — ACETAMINOPHEN 325 MG PO TABS
650.0000 mg | ORAL_TABLET | Freq: Four times a day (QID) | ORAL | 0 refills | Status: AC | PRN
  Filled 2022-03-15: qty 30, 4d supply, fill #0

## 2022-03-15 MED ORDER — ACETAMINOPHEN 325 MG PO TABS: 650.0000 mg | ORAL_TABLET | Freq: Four times a day (QID) | ORAL | 0 refills | Status: DC | PRN

## 2022-03-15 MED ORDER — ALBUTEROL SULFATE HFA 108 (90 BASE) MCG/ACT IN AERS
2.0000 | INHALATION_SPRAY | Freq: Four times a day (QID) | RESPIRATORY_TRACT | 0 refills | Status: DC | PRN
  Filled 2022-03-15: qty 6.7, 25d supply, fill #0

## 2022-03-15 MED ORDER — HYDROCHLOROTHIAZIDE 25 MG PO TABS: 25.0000 mg | ORAL_TABLET | Freq: Every day | ORAL | 1 refills | Status: DC

## 2022-03-15 MED ORDER — AMLODIPINE BESYLATE 10 MG PO TABS
10.0000 mg | ORAL_TABLET | Freq: Every day | ORAL | 2 refills | Status: DC
  Filled 2022-03-15: qty 30, 30d supply, fill #0

## 2022-03-15 MED ORDER — VITAMIN B-1 100 MG PO TABS
100.0000 mg | ORAL_TABLET | Freq: Every day | ORAL | 0 refills | Status: DC
  Filled 2022-03-15: qty 30, 30d supply, fill #0

## 2022-03-15 MED ORDER — ALBUTEROL SULFATE HFA 108 (90 BASE) MCG/ACT IN AERS: 2.0000 | INHALATION_SPRAY | Freq: Four times a day (QID) | RESPIRATORY_TRACT | 0 refills | Status: DC | PRN

## 2022-03-15 MED ORDER — HYDROCHLOROTHIAZIDE 25 MG PO TABS
25.0000 mg | ORAL_TABLET | Freq: Every day | ORAL | 1 refills | Status: DC
  Filled 2022-03-15: qty 30, 30d supply, fill #0

## 2022-03-15 MED ORDER — MECLIZINE HCL 25 MG PO TABS
25.0000 mg | ORAL_TABLET | Freq: Three times a day (TID) | ORAL | 0 refills | Status: AC | PRN
  Filled 2022-03-15: qty 30, 10d supply, fill #0

## 2022-03-15 MED ORDER — LISINOPRIL 10 MG PO TABS: 10.0000 mg | ORAL_TABLET | Freq: Every day | ORAL | 1 refills | Status: DC

## 2022-03-15 MED ORDER — LISINOPRIL 10 MG PO TABS
10.0000 mg | ORAL_TABLET | Freq: Every day | ORAL | 1 refills | Status: DC
  Filled 2022-03-15: qty 30, 30d supply, fill #0

## 2022-03-15 MED ORDER — NICOTINE 21 MG/24HR TD PT24
21.0000 mg | MEDICATED_PATCH | Freq: Every day | TRANSDERMAL | 0 refills | Status: DC
  Filled 2022-03-15: qty 28, 28d supply, fill #0

## 2022-03-15 MED ORDER — FOLIC ACID 1 MG PO TABS
1.0000 mg | ORAL_TABLET | Freq: Every day | ORAL | 0 refills | Status: DC
  Filled 2022-03-15: qty 30, 30d supply, fill #0

## 2022-03-15 MED ORDER — AMLODIPINE BESYLATE 10 MG PO TABS: 10.0000 mg | ORAL_TABLET | Freq: Every day | ORAL | 2 refills | Status: DC

## 2022-03-15 MED ORDER — VITAMIN B-1 100 MG PO TABS: 100.0000 mg | ORAL_TABLET | Freq: Every day | ORAL | 0 refills | Status: DC

## 2022-03-15 MED ORDER — NICOTINE 21 MG/24HR TD PT24: 21.0000 mg | MEDICATED_PATCH | Freq: Every day | TRANSDERMAL | 0 refills | Status: DC

## 2022-03-15 MED ORDER — AMLODIPINE BESYLATE 10 MG PO TABS
10.0000 mg | ORAL_TABLET | Freq: Every day | ORAL | Status: DC
  Administered 2022-03-15: 10 mg via ORAL
  Filled 2022-03-15: qty 1

## 2022-03-15 NOTE — TOC Transition Note (Signed)
Transition of Care [facility]) - CM/SW Discharge Note   Patient Details  Name: Bill Hammond MRN: [id number] Date of Birth: 05-18-1976  Transition of Care [facility]) CM/SW Contact:  Candie Chroman, LCSW Phone Number: 03/15/2022, 1:54 PM   Clinical Narrative:   Patient has orders to discharge home today. Pharm[facility]. No further concerns. CSW signing off.  Final next level of care: Home/Self Care Barriers to Discharge: Barriers Resolved   Patient Goals and CMS Choice Patient states their goals for this hospitalization and ongoing recovery are:: to go home CMS Medicare.gov Compare Post Acute Care list provided to:: Patient Choice offered to / list presented to : Patient  Discharge Placement                    Patient and family notified of of transfer: 03/15/22  Discharge Plan and Services                                     Social Determinants of Health (SDOH) Interventions     Readmission Risk Interventions     No data to display

## 2022-03-15 NOTE — Progress Notes (Signed)
PT Cancellation Note  Patient Details Name: Bill Hammond MRN: [id number] DOB: 23-Jul-1975   Cancelled Treatment:    Reason Eval/Treat Not Completed: PT screened, no needs identified, will sign off. Patient independently ambulating in the hallway. Reports dizziness is much improved. Signing off.    Trayven Lumadue 03/15/2022, 11:00 AM
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
## 2022-03-16 ENCOUNTER — Other Ambulatory Visit: Payer: Self-pay

## 2024-05-19 ENCOUNTER — Inpatient Hospital Stay
Admission: EM | Admit: 2024-05-19 | Discharge: 2024-05-20 | DRG: 291 | Disposition: A | Discharge status: Home / Self Care | Payer: MEDICAID | Attending: Internal Medicine | Admitting: Internal Medicine

## 2024-05-19 ENCOUNTER — Emergency Department: Payer: MEDICAID

## 2024-05-19 ENCOUNTER — Other Ambulatory Visit: Payer: Self-pay

## 2024-05-19 ENCOUNTER — Inpatient Hospital Stay (HOSPITAL_COMMUNITY)
Admit: 2024-05-19 | Discharge: 2024-05-19 | Disposition: A | Discharge status: Home / Self Care | Payer: MEDICAID | Attending: Internal Medicine | Admitting: Internal Medicine

## 2024-05-19 DIAGNOSIS — D509 Iron deficiency anemia, unspecified: Secondary | ICD-10-CM | POA: Diagnosis present

## 2024-05-19 DIAGNOSIS — I160 Hypertensive urgency: Principal | ICD-10-CM | POA: Diagnosis present

## 2024-05-19 DIAGNOSIS — Z91148 Patient's other noncompliance with medication regimen for other reason: Secondary | ICD-10-CM

## 2024-05-19 DIAGNOSIS — I5031 Acute diastolic (congestive) heart failure: Secondary | ICD-10-CM | POA: Diagnosis present

## 2024-05-19 DIAGNOSIS — Z720 Tobacco use: Secondary | ICD-10-CM

## 2024-05-19 DIAGNOSIS — F17210 Nicotine dependence, cigarettes, uncomplicated: Secondary | ICD-10-CM | POA: Diagnosis present

## 2024-05-19 DIAGNOSIS — I169 Hypertensive crisis, unspecified: Secondary | ICD-10-CM | POA: Diagnosis present

## 2024-05-19 DIAGNOSIS — R7989 Other specified abnormal findings of blood chemistry: Secondary | ICD-10-CM

## 2024-05-19 DIAGNOSIS — E876 Hypokalemia: Secondary | ICD-10-CM | POA: Diagnosis not present

## 2024-05-19 DIAGNOSIS — I110 Hypertensive heart disease with heart failure: Secondary | ICD-10-CM | POA: Diagnosis present

## 2024-05-19 DIAGNOSIS — T502X5A Adverse effect of carbonic-anhydrase inhibitors, benzothiadiazides and other diuretics, initial encounter: Secondary | ICD-10-CM | POA: Diagnosis present

## 2024-05-19 DIAGNOSIS — Z6838 Body mass index (BMI) 38.0-38.9, adult: Secondary | ICD-10-CM

## 2024-05-19 DIAGNOSIS — I2489 Other forms of acute ischemic heart disease: Secondary | ICD-10-CM | POA: Diagnosis present

## 2024-05-19 DIAGNOSIS — I509 Heart failure, unspecified: Secondary | ICD-10-CM

## 2024-05-19 DIAGNOSIS — R7303 Prediabetes: Secondary | ICD-10-CM | POA: Diagnosis present

## 2024-05-19 DIAGNOSIS — E66812 Obesity, class 2: Secondary | ICD-10-CM | POA: Diagnosis present

## 2024-05-19 DIAGNOSIS — F1090 Alcohol use, unspecified, uncomplicated: Secondary | ICD-10-CM

## 2024-05-19 DIAGNOSIS — E8770 Fluid overload, unspecified: Secondary | ICD-10-CM

## 2024-05-19 DIAGNOSIS — I10 Essential (primary) hypertension: Secondary | ICD-10-CM

## 2024-05-19 LAB — CBC
HCT: 30.4 % — ABNORMAL LOW (ref 39.0–52.0)
HCT: 33.9 % — ABNORMAL LOW (ref 39.0–52.0)
Hemoglobin: 8.6 g/dL — ABNORMAL LOW (ref 13.0–17.0)
Hemoglobin: 9.6 g/dL — ABNORMAL LOW (ref 13.0–17.0)
MCH: 22 pg — ABNORMAL LOW (ref 26.0–34.0)
MCH: 21.6 pg — ABNORMAL LOW (ref 26.0–34.0)
MCHC: 28.3 g/dL — ABNORMAL LOW (ref 30.0–36.0)
MCHC: 28.3 g/dL — ABNORMAL LOW (ref 30.0–36.0)
MCV: 77.7 fL — ABNORMAL LOW (ref 80.0–100.0)
MCV: 76.2 fL — ABNORMAL LOW (ref 80.0–100.0)
Platelets: 358 K/uL (ref 150–400)
Platelets: 406 K/uL — ABNORMAL HIGH (ref 150–400)
RBC: 3.91 MIL/uL — ABNORMAL LOW (ref 4.22–5.81)
RBC: 4.45 MIL/uL (ref 4.22–5.81)
RDW: 18 % — ABNORMAL HIGH (ref 11.5–15.5)
RDW: 18.2 % — ABNORMAL HIGH (ref 11.5–15.5)
WBC: 6 K/uL (ref 4.0–10.5)
WBC: 6.5 K/uL (ref 4.0–10.5)
nRBC: 0 % (ref 0.0–0.2)
nRBC: 0 % (ref 0.0–0.2)

## 2024-05-19 LAB — ECHOCARDIOGRAM COMPLETE
AR max vel: 4.11 cm2
AV Area VTI: 4.74 cm2
AV Area mean vel: 4.49 cm2
AV Mean grad: 2 mmHg
AV Peak grad: 4.2 mmHg
Ao pk vel: 1.02 m/s
Area-P 1/2: 4.06 cm2
Height: 72 in
MV VTI: 2.91 cm2
S' Lateral: 3.1 cm
Weight: 4532.66 [oz_av]

## 2024-05-19 LAB — HEPATIC FUNCTION PANEL
ALT: 41 U/L (ref 0–44)
AST: 46 U/L — ABNORMAL HIGH (ref 15–41)
Albumin: 3.9 g/dL (ref 3.5–5.0)
Alkaline Phosphatase: 166 U/L — ABNORMAL HIGH (ref 38–126)
Bilirubin, Direct: 0.2 mg/dL (ref 0.0–0.2)
Indirect Bilirubin: 0.2 mg/dL — ABNORMAL LOW (ref 0.3–0.9)
Total Bilirubin: 0.4 mg/dL (ref 0.0–1.2)
Total Protein: 6.9 g/dL (ref 6.5–8.1)

## 2024-05-19 LAB — BASIC METABOLIC PANEL WITH GFR
Anion gap: 12 (ref 5–15)
Anion gap: 13 (ref 5–15)
BUN: 7 mg/dL (ref 6–20)
BUN: 10 mg/dL (ref 6–20)
CO2: 23 mmol/L (ref 22–32)
CO2: 27 mmol/L (ref 22–32)
Calcium: 8.1 mg/dL — ABNORMAL LOW (ref 8.9–10.3)
Calcium: 8.6 mg/dL — ABNORMAL LOW (ref 8.9–10.3)
Chloride: 101 mmol/L (ref 98–111)
Chloride: 99 mmol/L (ref 98–111)
Creatinine, Ser: 0.77 mg/dL (ref 0.61–1.24)
Creatinine, Ser: 0.92 mg/dL (ref 0.61–1.24)
GFR, Estimated: >60 mL/min
GFR, Estimated: >60 mL/min
Glucose, Bld: 104 mg/dL — ABNORMAL HIGH (ref 70–99)
Glucose, Bld: 91 mg/dL (ref 70–99)
Potassium: 3.9 mmol/L (ref 3.5–5.1)
Potassium: 3.2 mmol/L — ABNORMAL LOW (ref 3.5–5.1)
Sodium: 136 mmol/L (ref 135–145)
Sodium: 140 mmol/L (ref 135–145)

## 2024-05-19 LAB — RETICULOCYTES
Immature Retic Fract: 37.9 % — ABNORMAL HIGH (ref 2.3–15.9)
RBC.: 4.32 MIL/uL (ref 4.22–5.81)
Retic Count, Absolute: 95.5 K/uL (ref 19.0–186.0)
Retic Ct Pct: 2.2 % (ref 0.4–3.1)

## 2024-05-19 LAB — LIPID PANEL
Cholesterol: 155 mg/dL (ref 0–200)
HDL: 45 mg/dL
LDL Cholesterol: 95 mg/dL (ref 0–99)
Total CHOL/HDL Ratio: 3.4 ratio
Triglycerides: 73 mg/dL
VLDL: 15 mg/dL (ref 0–40)

## 2024-05-19 LAB — VITAMIN B12: Vitamin B-12: 744 pg/mL (ref 180–914)

## 2024-05-19 LAB — URINE DRUG SCREEN
Amphetamines: NEGATIVE
Barbiturates: NEGATIVE
Benzodiazepines: NEGATIVE
Cocaine: NEGATIVE
Fentanyl: NEGATIVE
Methadone Scn, Ur: NEGATIVE
Opiates: NEGATIVE
Tetrahydrocannabinol: POSITIVE — AB

## 2024-05-19 LAB — IRON AND TIBC
Iron: 27 ug/dL — ABNORMAL LOW (ref 45–182)
Saturation Ratios: 5 % — ABNORMAL LOW (ref 17.9–39.5)
TIBC: 606 ug/dL — ABNORMAL HIGH (ref 250–450)
UIBC: 579 ug/dL

## 2024-05-19 LAB — CREATININE, SERUM
Creatinine, Ser: 0.84 mg/dL (ref 0.61–1.24)
GFR, Estimated: >60 mL/min

## 2024-05-19 LAB — LIPASE, BLOOD: Lipase: 45 U/L (ref 11–51)

## 2024-05-19 LAB — MRSA NEXT GEN BY PCR, NASAL: MRSA by PCR Next Gen: NOT DETECTED

## 2024-05-19 LAB — TROPONIN T, HIGH SENSITIVITY
Troponin T High Sensitivity: 81 ng/L — ABNORMAL HIGH (ref 0–19)
Troponin T High Sensitivity: 80 ng/L — ABNORMAL HIGH (ref 0–19)

## 2024-05-19 LAB — D-DIMER, QUANTITATIVE: D-Dimer, Quant: 1.14 ug{FEU}/mL — ABNORMAL HIGH (ref 0.00–0.50)

## 2024-05-19 LAB — HEMOGLOBIN A1C
Hgb A1c MFr Bld: 5.7 % — ABNORMAL HIGH (ref 4.8–5.6)
Mean Plasma Glucose: 116.89 mg/dL

## 2024-05-19 LAB — ETHANOL: Alcohol, Ethyl (B): <15 mg/dL

## 2024-05-19 LAB — GLUCOSE, CAPILLARY: Glucose-Capillary: 115 mg/dL — ABNORMAL HIGH (ref 70–99)

## 2024-05-19 LAB — FERRITIN: Ferritin: 20 ng/mL — ABNORMAL LOW (ref 24–336)

## 2024-05-19 LAB — HIV ANTIBODY (ROUTINE TESTING W REFLEX): HIV Screen 4th Generation wRfx: NONREACTIVE

## 2024-05-19 LAB — PRO BRAIN NATRIURETIC PEPTIDE: Pro Brain Natriuretic Peptide: 4006 pg/mL — ABNORMAL HIGH

## 2024-05-19 LAB — FOLATE: Folate: 9.4 ng/mL

## 2024-05-19 MED ORDER — LABETALOL HCL 5 MG/ML IV SOLN
10.0000 mg | Freq: Once | INTRAVENOUS | Status: AC
  Administered 2024-05-19: 10 mg via INTRAVENOUS
  Filled 2024-05-19: qty 4

## 2024-05-19 MED ORDER — NICARDIPINE HCL IN NACL 20-0.86 MG/200ML-% IV SOLN
3.0000 mg/h | INTRAVENOUS | Status: DC
  Administered 2024-05-19 (×2): 5 mg/h via INTRAVENOUS
  Administered 2024-05-19: 7.5 mg/h via INTRAVENOUS
  Administered 2024-05-19: 10 mg/h via INTRAVENOUS
  Administered 2024-05-19: 7.5 mg/h via INTRAVENOUS
  Administered 2024-05-19: 10 mg/h via INTRAVENOUS
  Administered 2024-05-20: 12.5 mg/h via INTRAVENOUS
  Administered 2024-05-20 (×2): 10 mg/h via INTRAVENOUS
  Administered 2024-05-20: 15 mg/h via INTRAVENOUS
  Administered 2024-05-20: 7.5 mg/h via INTRAVENOUS
  Administered 2024-05-20: 12.5 mg/h via INTRAVENOUS
  Filled 2024-05-19 (×12): qty 200

## 2024-05-19 MED ORDER — CHLORHEXIDINE GLUCONATE CLOTH 2 % EX PADS
6.0000 | MEDICATED_PAD | Freq: Every day | CUTANEOUS | Status: DC
  Administered 2024-05-19: 6 via TOPICAL

## 2024-05-19 MED ORDER — FUROSEMIDE 10 MG/ML IJ SOLN
20.0000 mg | Freq: Once | INTRAMUSCULAR | Status: AC
  Administered 2024-05-19: 20 mg via INTRAVENOUS
  Filled 2024-05-19: qty 4

## 2024-05-19 MED ORDER — ONDANSETRON HCL 4 MG/2ML IJ SOLN: 4.0000 mg | Freq: Four times a day (QID) | INTRAMUSCULAR | Status: DC | PRN

## 2024-05-19 MED ORDER — IOHEXOL 350 MG/ML SOLN
75.0000 mL | Freq: Once | INTRAVENOUS | Status: AC | PRN
  Administered 2024-05-19: 75 mL via INTRAVENOUS

## 2024-05-19 MED ORDER — HYDRALAZINE HCL 20 MG/ML IJ SOLN
10.0000 mg | Freq: Once | INTRAMUSCULAR | Status: AC
  Administered 2024-05-19: 10 mg via INTRAVENOUS
  Filled 2024-05-19: qty 1

## 2024-05-19 MED ORDER — ACETAMINOPHEN 325 MG PO TABS: 650.0000 mg | ORAL_TABLET | Freq: Four times a day (QID) | ORAL | Status: DC | PRN

## 2024-05-19 MED ORDER — LOSARTAN POTASSIUM 50 MG PO TABS
50.0000 mg | ORAL_TABLET | Freq: Every day | ORAL | Status: DC
  Administered 2024-05-19 – 2024-05-20 (×2): 50 mg via ORAL
  Filled 2024-05-19 (×2): qty 1

## 2024-05-19 MED ORDER — ORAL CARE MOUTH RINSE: 15.0000 mL | OROMUCOSAL | Status: DC | PRN

## 2024-05-19 MED ORDER — NICOTINE 21 MG/24HR TD PT24
21.0000 mg | MEDICATED_PATCH | Freq: Every day | TRANSDERMAL | Status: DC
  Administered 2024-05-19 – 2024-05-20 (×2): 21 mg via TRANSDERMAL
  Filled 2024-05-19 (×2): qty 1

## 2024-05-19 MED ORDER — ONDANSETRON HCL 4 MG PO TABS: 4.0000 mg | ORAL_TABLET | Freq: Four times a day (QID) | ORAL | Status: DC | PRN

## 2024-05-19 MED ORDER — CLEVIDIPINE BUTYRATE 0.5 MG/ML IV EMUL
0.0000 mg/h | INTRAVENOUS | Status: DC
  Administered 2024-05-19: 2 mg/h via INTRAVENOUS
  Filled 2024-05-19: qty 50

## 2024-05-19 MED ORDER — POTASSIUM CHLORIDE CRYS ER 20 MEQ PO TBCR
40.0000 meq | EXTENDED_RELEASE_TABLET | Freq: Once | ORAL | Status: AC
  Administered 2024-05-19: 40 meq via ORAL
  Filled 2024-05-19: qty 2

## 2024-05-19 MED ORDER — BENZONATATE 100 MG PO CAPS
200.0000 mg | ORAL_CAPSULE | Freq: Three times a day (TID) | ORAL | Status: DC | PRN
  Administered 2024-05-19: 200 mg via ORAL
  Filled 2024-05-19: qty 2

## 2024-05-19 MED ORDER — FUROSEMIDE 10 MG/ML IJ SOLN
40.0000 mg | Freq: Every day | INTRAMUSCULAR | Status: DC
  Administered 2024-05-19: 40 mg via INTRAVENOUS
  Filled 2024-05-19: qty 4

## 2024-05-19 MED ORDER — ENOXAPARIN SODIUM 80 MG/0.8ML IJ SOSY
0.5000 mg/kg | PREFILLED_SYRINGE | INTRAMUSCULAR | Status: DC
  Administered 2024-05-20: 65 mg via SUBCUTANEOUS
  Filled 2024-05-19: qty 0.8

## 2024-05-19 MED ORDER — ACETAMINOPHEN 650 MG RE SUPP: 650.0000 mg | Freq: Four times a day (QID) | RECTAL | Status: DC | PRN

## 2024-05-19 MED ORDER — ENOXAPARIN SODIUM 80 MG/0.8ML IJ SOSY
0.5000 mg/kg | PREFILLED_SYRINGE | INTRAMUSCULAR | Status: DC
  Administered 2024-05-19: 75 mg via SUBCUTANEOUS
  Filled 2024-05-19: qty 0.75

## 2024-05-19 MED ORDER — POLYETHYLENE GLYCOL 3350 17 G PO PACK: 17.0000 g | PACK | Freq: Every day | ORAL | Status: DC | PRN

## 2024-05-19 NOTE — ED Provider Notes (Signed)
 "  [facility] Provider Note    Event Date/Time   First MD Initiated Contact with Patient 05/19/24 0259     (approximate)   History   Hypertension   HPI  Bill Hammond is a 48 y.o. male  with past medical history significant for hypertension, alcohol and tobacco abuse, morbid obesity BMI 43 who presents to emergency department with 2 weeks of progressively worsening lower extremity edema and shortness of breath.  Patient reports that he has not taken any of his blood pressure medications for the past year.  2 weeks ago he noticed lower extremity swelling and noticed that his blood pressure was elevated.  He subsequently developed some shortness of breath that has been ongoing.  Denies any chest pain.  He took some leftover blood pressure medications he had at home but does not recall the names.  Denies any fevers chills cough congestion changes in urinary or bowel habits or abdominal pain      Physical Exam   Triage Vital Signs: ED Triage Vitals  Encounter Vitals Group     BP 05/19/24 0254 (!) 246/156     Girls Systolic BP Percentile --      Girls Diastolic BP Percentile --      Boys Systolic BP Percentile --      Boys Diastolic BP Percentile --      Pulse Rate 05/19/24 0254 (!) 117     Resp 05/19/24 0254 20     Temp 05/19/24 0254 98.4 F (36.9 C)     Temp src --      SpO2 05/19/24 0254 100 %     Weight 05/19/24 0253 (!) 326 lb (147.9 kg)     Height 05/19/24 0253 6' (1.829 m)     Head Circumference --      Peak Flow --      Pain Score 05/19/24 0253 0     Pain Loc --      Pain Education --      Exclude from Growth Chart --     Most recent vital signs: Vitals:   05/19/24 0630 05/19/24 0634  BP: (!) 197/130 (!) 197/130  Pulse: 91   Resp: (!) 22   Temp:    SpO2: 100%     Nursing Triage Note reviewed. Vital signs reviewed and patients oxygen saturation is normoxic  General: Patient is well nourished, well developed, awake and alert,  resting comfortably in no acute distress Head: Normocephalic and atraumatic Eyes: Normal inspection, extraocular muscles intact, no conjunctival pallor Ear, nose, throat: Normal external exam Neck: Normal range of motion Respiratory: Patient is in no respiratory distress, lungs CTAB Cardiovascular: Patient is tachycardic, RR GI: Abd SNT with no guarding or rebound  Back: Normal inspection of the back with good strength and range of motion throughout all ext Extremities: pulses intact with good cap refills, 2+ LE edema  Neuro: The patient is alert and oriented to person, place, and time, appropriately conversive, with 5/5 bilat UE/LE strength, no gross motor or sensory defects noted. Coordination appears to be adequate. Skin: Warm, dry, and intact Psych: normal mood and affect, no SI or HI  ED Results / Procedures / Treatments   Labs (all labs ordered are listed, but only abnormal results are displayed) Labs Reviewed  BASIC METABOLIC PANEL WITH GFR - Abnormal; Notable for the following components:      Result Value   Glucose, Bld 104 (*)    Calcium  8.1 (*)  All other components within normal limits  CBC - Abnormal; Notable for the following components:   RBC 3.91 (*)    Hemoglobin 8.6 (*)    HCT 30.4 (*)    MCV 77.7 (*)    MCH 22.0 (*)    MCHC 28.3 (*)    RDW 18.0 (*)    All other components within normal limits  PRO BRAIN NATRIURETIC PEPTIDE - Abnormal; Notable for the following components:   Pro Brain Natriuretic Peptide 4,006.0 (*)    All other components within normal limits  D-DIMER, QUANTITATIVE - Abnormal; Notable for the following components:   D-Dimer, Quant 1.14 (*)    All other components within normal limits  URINE DRUG SCREEN - Abnormal; Notable for the following components:   Tetrahydrocannabinol POSITIVE (*)    All other components within normal limits  HEPATIC FUNCTION PANEL - Abnormal; Notable for the following components:   AST 46 (*)    Alkaline  Phosphatase 166 (*)    Indirect Bilirubin 0.2 (*)    All other components within normal limits  TROPONIN T, HIGH SENSITIVITY - Abnormal; Notable for the following components:   Troponin T High Sensitivity 81 (*)    All other components within normal limits  ETHANOL  LIPASE, BLOOD  TROPONIN T, HIGH SENSITIVITY     EKG EKG and rhythm strip are interpreted by myself:   EKG: tachycardic sinus rhythm] at heart rate of 110, normal QRS duration, QTc 476, nonspecific ST segments and T waves no ectopy EKG not consistent with Acute STEMI Rhythm strip: Tachycardia in lead II   RADIOLOGY Chest x-ray: Consistent with pleural edema my independent review and interpretation and radiologist agrees    PROCEDURES:  Critical Care performed: Yes, see critical care procedure note(s)  .Critical Care  Performed by: Nicholaus Rolland BRAVO, MD Authorized by: Nicholaus Rolland BRAVO, MD   Critical care provider statement:    Critical care time (minutes):  35   Critical care was necessary to treat or prevent imminent or life-threatening deterioration of the following conditions:  Circulatory failure   Critical care was time spent personally by me on the following activities:  Development of treatment plan with patient or surrogate, discussions with consultants, evaluation of patient's response to treatment, examination of patient, ordering and review of laboratory studies, ordering and review of radiographic studies, ordering and performing treatments and interventions, pulse oximetry, re-evaluation of patient's condition and review of old charts   Care discussed with: admitting provider   Comments:     Need for medications that affect BP multiple times resulting in clevipedine drip    MEDICATIONS ORDERED IN ED: Medications  clevidipine  (CLEVIPREX ) infusion 0.5 mg/mL (has no administration in time range)  labetalol  (NORMODYNE ) injection 10 mg (10 mg Intravenous Given 05/19/24 0500)  hydrALAZINE  (APRESOLINE )  injection 10 mg (10 mg Intravenous Given 05/19/24 0634)  labetalol  (NORMODYNE ) injection 10 mg (10 mg Intravenous Given 05/19/24 0626)  furosemide  (LASIX ) injection 20 mg (20 mg Intravenous Given 05/19/24 0626)  iohexol  (OMNIPAQUE ) 350 MG/ML injection 75 mL (75 mLs Intravenous Contrast Given 05/19/24 0648)     IMPRESSION / MDM / ASSESSMENT AND PLAN / ED COURSE                                Differential diagnosis includes, but is not limited to, hypertensive crisis, CHF, PE, pneumonia, electrolyte derangement   ED course: Patient presents with an extremely elevated blood  pressure but is satting well on room air.  Chest x-ray consistent with pleural edema.  Received 1 dose of labetalol  which lowered his blood pressure slightly but still remains elevated.  He had no profound electrolyte derangements and consequently I have ordered Lasix  labetalol  and hydralazine .  If this does not control his blood pressure he may need a Cleviprex  drip.  D-dimer was elevated and a CT PE is pending.  Troponin is elevated however I think this is secondary to congestive heart failure as patient denies any chest pain at this time.  Anticipate patient will need to come in for admission today   Clinical Course as of 05/19/24 0731  Tue May 19, 2024  [id number] Patient denies any cocaine use and past drug screens have not been positive for cocaine.  Will bring his blood pressure down with labetalol  at this time [HD]  0419 DG Chest 2 View Cardiomegaly elevated blood pressure [HD]  0608 Pro Brain Natriuretic Peptide(!): 4,006.0 Very elevated [HD]  0608 D-Dimer, Quant(!): 1.14 Elevated [HD]  0617 Troponin T High Sensitivity(!): 81 [HD]  0618 Creatinine: 0.77 No AKI [HD]  0719 Patient's blood pressure was still remaining incredibly elevated despite multiple medications.  Will start the patient on a Cleviprex  drip after discussion of pharmacy.  Will call patient in for admission [HD]    Clinical Course User Index [HD]  Nicholaus Rolland BRAVO, MD   -- Risk: 5 This patient has a high risk of morbidity due to further diagnostic testing or treatment. Rationale: This patients evaluation and management involve a high risk of morbidity due to the potential severity of presenting symptoms, need for diagnostic testing, and/or initiation of treatment that may require close monitoring. The differential includes conditions with potential for significant deterioration or requiring escalation of care. Treatment decisions in the ED, including medication administration, procedural interventions, or disposition planning, reflect this level of risk. COPA: 5 The patient has the following acute or chronic illness/injury that poses a possible threat to life or bodily function: [X] : The patient has a potentially serious acute condition or an acute exacerbation of a chronic illness requiring urgent evaluation and management in the Emergency Department. The clinical presentation necessitates immediate consideration of life-threatening or function-threatening diagnoses, even if they are ultimately ruled out.   FINAL CLINICAL IMPRESSION(S) / ED DIAGNOSES   Final diagnoses:  Hypertensive crisis  Congestive heart failure, unspecified HF chronicity, unspecified heart failure type (HCC)  Non compliance w medication regimen     Rx / DC Orders   ED Discharge Orders     None        Note:  This document was prepared using Dragon voice recognition software and may include unintentional dictation errors.   Nicholaus Rolland BRAVO, MD 05/19/24 [phone]  "

## 2024-05-19 NOTE — Assessment & Plan Note (Signed)
-   CIWA assessment without Ativan  for now, we can add Ativan  if needed

## 2024-05-19 NOTE — Progress Notes (Signed)
*  PRELIMINARY RESULTS* Echocardiogram 2D Echocardiogram has been performed.  Floydene Harder 05/19/2024, 2:34 PM

## 2024-05-19 NOTE — H&P (Signed)
 " History and Physical    Patient: Bill Hammond FMW:[id number] DOB: 1976/01/22 DOA: 05/19/2024 DOS: the patient was seen and examined on 05/19/2024 PCP: Pcp, No  Patient coming from: Home  Chief Complaint:  Chief Complaint  Patient presents with   Hypertension   HPI: Bill Hammond is a 48 y.o. male with medical history significant of hypertension,Alcohol and tobacco abuse but not taking medications for a while, presented to ED with 2 weeks of progressive lower extremity edema, exertional dyspnea and orthopnea.  Denies any chest pain but does feel some chest tightness.  Per patient he stopped taking his blood pressure medications for a while due to insurance issues.  Patient denies any recent illness, upper respiratory or urinary symptoms.  No recent change in appetite or bowel habits.  Denies any obvious bleeding.  Patient is a current smoker and uses alcohol on most of the days.  ED course and data reviewed.  On presentation patient has significantly elevated blood pressure at 246/158, labs with hemoglobin of 8.6, D-dimer 1.18, proBNP 4006, troponin 81>> 80, AST 46, alkaline phosphatase 166, UDS positive for THC, renal functions normal.  CXR with cardiomegaly and mild pulmonary edema.  CTA chest was negative for PE, enlarged pulmonary outflow tract suggestive of pulmonary arterial hypertension and hepatic steatosis. Lower extremity venous Doppler negative for DVT EKG with mild sinus tachycardia, right axis and no other significant abnormality, QTc of 476.  Patient was given multiple doses of labetalol  and hydralazine  and then started on Cleviprex  due to poor response.  Also received 1 dose of IV Lasix .  Review of Systems: As mentioned in the history of present illness. All other systems reviewed and are negative. Past Medical History:  Diagnosis Date   ETOH abuse    Hypertension    Priapism    Renal disorder    kidney failure   Tobacco abuse    Past Surgical History:   Procedure Laterality Date   PRIAPISM REPAIR N/A 05/28/2018   Procedure: PENILE SHUNT;  Surgeon: Sherrilee Belvie CROME, MD;  Location: [facility] ORS;  Service: Urology;  Laterality: N/A;   PRIAPISM REPAIR N/A 06/05/2018   Procedure: DISTAL PENILE SHUNT (TSHUNT);  Surgeon: Francisca Redell BROCKS, MD;  Location: [facility] ORS;  Service: Urology;  Laterality: N/A;   Social History:  reports that he has been smoking cigarettes. He has never used smokeless tobacco. He reports current alcohol use. He reports that he does not use drugs.  Allergies[1]  History reviewed. No pertinent family history.  Prior to Admission medications  Medication Sig Start Date End Date Taking? Authorizing Provider  acetaminophen  (TYLENOL ) 325 MG tablet Take 2 tablets (650 mg total) by mouth every 6 (six) hours as needed for mild pain or fever. Patient not taking: Reported on 05/19/2024 03/15/22   Regalado, Owen A, MD  albuterol  (VENTOLIN  HFA) 108 (90 Base) MCG/ACT inhaler Inhale 2 puffs into the lungs every 6 (six) hours as needed for wheezing or shortness of breath. Patient not taking: Reported on 05/19/2024 03/15/22   Regalado, Owen A, MD  amLODipine  (NORVASC ) 10 MG tablet Take 1 tablet (10 mg total) by mouth daily. Patient not taking: Reported on 05/19/2024 03/16/22   Regalado, Owen A, MD  folic acid  (FOLVITE ) 1 MG tablet Take 1 tablet (1 mg total) by mouth daily. Patient not taking: Reported on 05/19/2024 03/16/22   Regalado, Owen A, MD  hydrochlorothiazide  (HYDRODIURIL ) 25 MG tablet Take 1 tablet (25 mg total) by mouth daily. Patient not taking: Reported on  05/19/2024 03/16/22   Regalado, Owen A, MD  lisinopril  (ZESTRIL ) 10 MG tablet Take 1 tablet (10 mg total) by mouth daily. Patient not taking: Reported on 05/19/2024 03/16/22   Regalado, Belkys A, MD  meclizine  (ANTIVERT ) 25 MG tablet Take 1 tablet (25 mg total) by mouth 3 (three) times daily as needed for dizziness. Patient not taking: Reported on 05/19/2024 03/15/22    Regalado, Owen A, MD  nicotine  (NICODERM CQ  - DOSED IN MG/24 HOURS) 21 mg/24hr patch Place 1 patch (21 mg total) onto the skin daily. Patient not taking: Reported on 05/19/2024 03/16/22   Regalado, Owen A, MD  thiamine  (VITAMIN B-1) 100 MG tablet Take 1 tablet (100 mg total) by mouth daily. Patient not taking: Reported on 05/19/2024 03/16/22   Madelyne Owen LABOR, MD    Physical Exam: Vitals:   05/19/24 1215 05/19/24 1245 05/19/24 1300 05/19/24 1315  BP: (!) 179/102 (!) 195/127 (!) 192/119 (!) 184/110  Pulse: 93 94 97 94  Resp: 20 20 (!) 22 16  Temp:  98.5 F (36.9 C)    TempSrc:  Oral    SpO2: 99% 100% 100% 100%  Weight:  128.5 kg    Height:       General: Vital signs reviewed.  Patient is well-developed and obese, in no acute distress and cooperative with exam.  Head: Normocephalic and atraumatic. Eyes: EOMI, conjunctivae normal, no scleral icterus.  Neck: Supple, trachea midline, normal ROM,  Cardiovascular: RRR, S1 normal, S2 normal, no murmurs, gallops, or rubs. Pulmonary/Chest: Clear to auscultation bilaterally, no wheezes, rales, or rhonchi. Abdominal: Soft, non-tender, non-distended, BS +,  Extremities: 1+ lower extremity edema bilaterally,  pulses symmetric and intact bilaterally.  Neurological: A&O x3, Strength is normal and symmetric bilaterally, cranial nerve II-XII are grossly intact, no focal motor deficit, sensory intact to light touch bilaterally.  Skin: Warm, dry and intact.  Psychiatric: Normal mood and affect.    Data Reviewed: Prior data reviewed as mentioned above  Assessment and Plan: * Hypertensive urgency Blood pressure significantly elevated requiring Cleviprex  infusion as he failed multiple bolus doses of labetalol  and hydralazine  in ED.  Apparently was on antihypertensives but has not seen physician for a while due to insurance issues. -Admit to stepdown - Cleviprex  switched with Cardene  at nursing request as Cleviprex  cannot be done as  stepdown. - Started on losartan  -Added daily Lasix  -Monitor blood pressure  Elevated troponin Patient initially has some chest tightness likely secondary to hypertensive urgency, chest tightness resolved with some improvement in blood pressure. Mildly elevated troponin with a flat curve likely secondary to demand ischemia with significantly elevated blood pressure. -Echocardiogram ordered - Check A1c and lipid panel  D-dimer, elevated Mildly elevated D-dimer.  Lower extremity venous Doppler was negative for DVT and CTA chest was also obtained in ED and it was negative for PE.  Did show some concern of pulmonary hypertension.   Fluid overload No prior diagnosis of heart failure.  Lower extremity edema on presentation with significantly elevated blood pressure.  Patient also has exertional dyspnea and orthopnea.  Received 1 dose of Lasix  20 mg in ED. - Echocardiogram ordered -Continuing Lasix  40 mg daily -Strict intake and output -Daily weight -Follow-up echocardiogram  Microcytic anemia Patient denies any obvious bleeding.  Has not seen physician for a long time, no baseline screening.  Hemoglobin of 8.6 with microcytosis on presentation, improved to 9.3 on repeat.  48 year old gentleman with microcytic anemia will need outpatient colonoscopy and screening. - Check anemia panel -Monitor  hemoglobin  Obesity, Class II, BMI 35-39.9 Estimated body mass index is 38.42 kg/m as calculated from the following:   Height as of this encounter: 6' (1.829 m).   Weight as of this encounter: 128.5 kg.   - Encouraged weight loss with exercise and healthy diet  Alcohol use - CIWA assessment without Ativan  for now, we can add Ativan  if needed  Tobacco abuse Counseling was provided. -Nicotine  patch as needed  Advance Care Planning:   Code Status: Full Code, discussed with patient  Consults: None  Family Communication: No family at bedside  Severity of Illness: The appropriate patient  status for this patient is INPATIENT. Inpatient status is judged to be reasonable and necessary in order to provide the required intensity of service to ensure the patient's safety. The patient's presenting symptoms, physical exam findings, and initial radiographic and laboratory data in the context of their chronic comorbidities is felt to place them at high risk for further clinical deterioration. Furthermore, it is not anticipated that the patient will be medically stable for discharge from the hospital within 2 midnights of admission.   * I certify that at the point of admission it is my clinical judgment that the patient will require inpatient hospital care spanning beyond 2 midnights from the point of admission due to high intensity of service, high risk for further deterioration and high frequency of surveillance required.*  This record has been created using Conservation officer, historic buildings. Errors have been sought and corrected,but may not always be located. Such creation errors do not reflect on the standard of care.   Author: Amaryllis Dare, MD 05/19/2024 1:57 PM  For on call review [email or web address].     [1]  Allergies Allergen Reactions   Aspirin    Nsaids    "

## 2024-05-19 NOTE — Progress Notes (Signed)
 Pt arrived to ICU room 11 from ED. Upon assessment, VS obtained and full assessment completed by this RN. Telemetry connected and notified. Fall precautions in place.

## 2024-05-19 NOTE — Assessment & Plan Note (Signed)
 Patient denies any obvious bleeding.  Has not seen physician for a long time, no baseline screening.  Hemoglobin of 8.6 with microcytosis on presentation, improved to 9.3 on repeat.  48 year old gentleman with microcytic anemia will need outpatient colonoscopy and screening. - Check anemia panel -Monitor hemoglobin

## 2024-05-19 NOTE — Assessment & Plan Note (Signed)
Counseling was provided. -Nicotine patch as needed 

## 2024-05-19 NOTE — Assessment & Plan Note (Signed)
 No prior diagnosis of heart failure.  Lower extremity edema on presentation with significantly elevated blood pressure.  Patient also has exertional dyspnea and orthopnea.  Received 1 dose of Lasix  20 mg in ED. - Echocardiogram ordered -Continuing Lasix  40 mg daily -Strict intake and output -Daily weight -Follow-up echocardiogram

## 2024-05-19 NOTE — Assessment & Plan Note (Signed)
 Mildly elevated D-dimer.  Lower extremity venous Doppler was negative for DVT and CTA chest was also obtained in ED and it was negative for PE.  Did show some concern of pulmonary hypertension.

## 2024-05-19 NOTE — Assessment & Plan Note (Signed)
 Blood pressure significantly elevated requiring Cleviprex  infusion as he failed multiple bolus doses of labetalol  and hydralazine  in ED.  Apparently was on antihypertensives but has not seen physician for a while due to insurance issues. -Admit to stepdown - Cleviprex  switched with Cardene  at nursing request as Cleviprex  cannot be done as stepdown. - Started on losartan  -Added daily Lasix  -Monitor blood pressure

## 2024-05-19 NOTE — Progress Notes (Signed)
 PHARMACIST - PHYSICIAN COMMUNICATION  CONCERNING:  Enoxaparin  (Lovenox ) for DVT Prophylaxis    RECOMMENDATION: Patient was prescribed enoxaprin 40mg  q24 hours for VTE prophylaxis.   Filed Weights   05/19/24 0253  Weight: (!) 147.9 kg (326 lb)    Body mass index is 44.21 kg/m.  Estimated Creatinine Clearance: 172.5 mL/min (by C-G formula based on SCr of 0.77 mg/dL).  Based on [facility] policy patient is candidate for enoxaparin  0.5mg /kg TBW SQ every 24 hours based on BMI being >30.  DESCRIPTION: Pharmacy has adjusted enoxaparin  dose per [facility] policy.  Patient is now receiving enoxaparin  75 mg every 24 hours    Damien Napoleon, PharmD Clinical Pharmacist  05/19/2024 9:22 AM

## 2024-05-19 NOTE — Plan of Care (Signed)

## 2024-05-19 NOTE — Assessment & Plan Note (Signed)
 Estimated body mass index is 38.42 kg/m as calculated from the following:   Height as of this encounter: 6' (1.829 m).   Weight as of this encounter: 128.5 kg.   - Encouraged weight loss with exercise and healthy diet

## 2024-05-19 NOTE — ED Triage Notes (Signed)
 Pt reports he has been out of his HTN meds for the past year, pt reports for the past week he has been taking some old ones he found. Pt reports he has had some lower leg swelling and shortness of breath.

## 2024-05-19 NOTE — ED Notes (Signed)
 NURSE JENNIFER RN INFORMED OF BED ASSIGNED

## 2024-05-19 NOTE — Assessment & Plan Note (Addendum)
 Patient initially has some chest tightness likely secondary to hypertensive urgency, chest tightness resolved with some improvement in blood pressure. Mildly elevated troponin with a flat curve likely secondary to demand ischemia with significantly elevated blood pressure. -Echocardiogram ordered - Check A1c and lipid panel

## 2024-05-20 ENCOUNTER — Other Ambulatory Visit: Payer: Self-pay

## 2024-05-20 DIAGNOSIS — I5031 Acute diastolic (congestive) heart failure: Secondary | ICD-10-CM

## 2024-05-20 LAB — BASIC METABOLIC PANEL WITH GFR
Anion gap: 14 (ref 5–15)
BUN: 8 mg/dL (ref 6–20)
CO2: 25 mmol/L (ref 22–32)
Calcium: 8 mg/dL — ABNORMAL LOW (ref 8.9–10.3)
Chloride: 100 mmol/L (ref 98–111)
Creatinine, Ser: 0.76 mg/dL (ref 0.61–1.24)
GFR, Estimated: >60 mL/min
Glucose, Bld: 94 mg/dL (ref 70–99)
Potassium: 3.2 mmol/L — ABNORMAL LOW (ref 3.5–5.1)
Sodium: 139 mmol/L (ref 135–145)

## 2024-05-20 LAB — CBC
HCT: 32.3 % — ABNORMAL LOW (ref 39.0–52.0)
Hemoglobin: 9.3 g/dL — ABNORMAL LOW (ref 13.0–17.0)
MCH: 21.8 pg — ABNORMAL LOW (ref 26.0–34.0)
MCHC: 28.8 g/dL — ABNORMAL LOW (ref 30.0–36.0)
MCV: 75.8 fL — ABNORMAL LOW (ref 80.0–100.0)
Platelets: 382 K/uL (ref 150–400)
RBC: 4.26 MIL/uL (ref 4.22–5.81)
RDW: 18.2 % — ABNORMAL HIGH (ref 11.5–15.5)
WBC: 7.4 K/uL (ref 4.0–10.5)
nRBC: 0 % (ref 0.0–0.2)

## 2024-05-20 MED ORDER — TORSEMIDE 20 MG PO TABS
20.0000 mg | ORAL_TABLET | Freq: Every day | ORAL | Status: DC
  Administered 2024-05-20: 20 mg via ORAL
  Filled 2024-05-20: qty 1

## 2024-05-20 MED ORDER — FE FUM-VIT C-VIT B12-FA 460-60-0.01-1 MG PO CAPS: 1.0000 | ORAL_CAPSULE | Freq: Two times a day (BID) | ORAL | Status: DC

## 2024-05-20 MED ORDER — FE FUM-VIT C-VIT B12-FA 460-60-0.01-1 MG PO CAPS
1.0000 | ORAL_CAPSULE | Freq: Two times a day (BID) | ORAL | 2 refills | Status: AC
  Filled 2024-05-20: qty 60, 30d supply, fill #0

## 2024-05-20 MED ORDER — HYDROCHLOROTHIAZIDE 25 MG PO TABS
25.0000 mg | ORAL_TABLET | Freq: Every day | ORAL | 1 refills | Status: AC
  Filled 2024-05-20: qty 30, 30d supply, fill #0

## 2024-05-20 MED ORDER — LOSARTAN POTASSIUM 50 MG PO TABS
50.0000 mg | ORAL_TABLET | Freq: Every day | ORAL | 2 refills | Status: DC
  Filled 2024-05-20: qty 30, 30d supply, fill #0

## 2024-05-20 MED ORDER — IRON SUCROSE 200 MG IVPB - SIMPLE MED
200.0000 mg | Freq: Once | Status: AC
  Administered 2024-05-20: 200 mg via INTRAVENOUS
  Filled 2024-05-20: qty 200

## 2024-05-20 MED ORDER — POTASSIUM CHLORIDE CRYS ER 20 MEQ PO TBCR
40.0000 meq | EXTENDED_RELEASE_TABLET | Freq: Two times a day (BID) | ORAL | Status: DC
  Administered 2024-05-20: 40 meq via ORAL
  Filled 2024-05-20: qty 2

## 2024-05-20 MED ORDER — ROSUVASTATIN CALCIUM 10 MG PO TABS
10.0000 mg | ORAL_TABLET | Freq: Every day | ORAL | Status: DC
  Administered 2024-05-20: 10 mg via ORAL
  Filled 2024-05-20: qty 1

## 2024-05-20 MED ORDER — AMLODIPINE BESYLATE 10 MG PO TABS
10.0000 mg | ORAL_TABLET | Freq: Every day | ORAL | 2 refills | Status: AC
  Filled 2024-05-20: qty 30, 30d supply, fill #0

## 2024-05-20 MED ORDER — ALBUTEROL SULFATE HFA 108 (90 BASE) MCG/ACT IN AERS
2.0000 | INHALATION_SPRAY | Freq: Four times a day (QID) | RESPIRATORY_TRACT | 1 refills | Status: AC | PRN
  Filled 2024-05-20: qty 6.7, 25d supply, fill #0

## 2024-05-20 MED ORDER — CHLORHEXIDINE GLUCONATE CLOTH 2 % EX PADS: 6.0000 | MEDICATED_PAD | Freq: Every day | CUTANEOUS | Status: DC

## 2024-05-20 MED ORDER — AMLODIPINE BESYLATE 10 MG PO TABS
10.0000 mg | ORAL_TABLET | Freq: Every day | ORAL | Status: DC
  Administered 2024-05-20: 10 mg via ORAL
  Filled 2024-05-20: qty 1

## 2024-05-20 MED ORDER — ROSUVASTATIN CALCIUM 10 MG PO TABS
10.0000 mg | ORAL_TABLET | Freq: Every day | ORAL | 1 refills | Status: AC
  Filled 2024-05-20: qty 90, 90d supply, fill #0

## 2024-05-20 MED ORDER — BENZONATATE 200 MG PO CAPS
200.0000 mg | ORAL_CAPSULE | Freq: Three times a day (TID) | ORAL | 0 refills | Status: AC | PRN
  Filled 2024-05-20: qty 30, 10d supply, fill #0

## 2024-05-20 NOTE — Discharge Instructions (Signed)
 Some PCP options in [place] area- not a comprehensive list  [facility]- [phone] [facility]- [phone] [facility]- [phone] [facility]- [phone] [facility]- [phone] Nichole Molly- [phone]  or [facility] Physician Referral Line [phone]

## 2024-05-20 NOTE — Discharge Summary (Signed)
 " Physician Discharge Summary   Patient: Bill Hammond MRN: [id number] DOB: 11/21/1975  Admit date:     05/19/2024  Discharge date: 05/20/2024  Discharge Physician: Amaryllis Dare   PCP: Pcp, No   Recommendations at discharge:  Please obtain CBC and BMP on follow-up Follow-up with primary care provider Follow-up with gastroenterology Follow-up with cardiology  Discharge Diagnoses: Principal Problem:   Hypertensive urgency Active Problems:   Elevated troponin   D-dimer, elevated   Fluid overload   Microcytic anemia   Obesity, Class II, BMI 35-39.9   Alcohol use   Tobacco abuse   Congestive heart failure (HCC)   Non compliance w medication regimen   Hospital Course: Bill Hammond is a 48 y.o. male with medical history significant of hypertension,Alcohol and tobacco abuse but not taking medications for a while, presented to ED with 2 weeks of progressive lower extremity edema, exertional dyspnea and orthopnea.  Denies any chest pain but does feel some chest tightness.  Per patient he stopped taking his blood pressure medications for a while due to insurance issues.   Patient denies any recent illness, upper respiratory or urinary symptoms.  No recent change in appetite or bowel habits.  Denies any obvious bleeding.   Patient is a current smoker and uses alcohol on most of the days.  On presentation patient has significantly elevated blood pressure at 246/158, labs with hemoglobin of 8.6, D-dimer 1.18, proBNP 4006, troponin 81>> 80, AST 46, alkaline phosphatase 166, UDS positive for THC, renal functions normal.  CXR with cardiomegaly and mild pulmonary edema.  CTA chest was negative for PE, enlarged pulmonary outflow tract suggestive of pulmonary arterial hypertension and hepatic steatosis. Lower extremity venous Doppler negative for DVT EKG with mild sinus tachycardia, right axis and no other significant abnormality, QTc of 476.   Patient was given multiple doses of labetalol   and hydralazine  and then started on Cleviprex  due to poor response.  Also received 1 dose of IV Lasix .  Cleviprex  was later transition with Cardene  so he can be managed in stepdown.  Echocardiogram with normal EF, biatrial dilatation and grade 2 diastolic dysfunction, likely secondary to uncontrolled hypertension.  Blood pressure slowly improved, he was started on amlodipine , losartan  and HCTZ.  Patient had a very good response with Lasix , UOP of more than 10 L in 1 day.  Lower extremity edema resolved.  No chest pain or shortness of breath.  Further diuresis was held.  Did develop mild hypokalemia due to diuresis which was repleted.  Patient was given a referral to see a cardiologist and get established in heart failure clinic for better management of his diastolic heart failure.  For his microcytic anemia, he was found to have significant iron  deficiency, talked with GI and he was given a referral to see gastroenterology as outpatient as he will need proper screening to rule out any underlying colon malignancy.  He was given 1 dose of IV iron  and started on p.o. supplement.  Patient was found to have A1c of 5.7 which makes him prediabetic.  Lipid panel was normal but LDL of 95 with a goal less than 70, he was started on low-dose Crestor  and primary care provider can further monitor.  Patient should get established with a primary care provider, resources were provided.  He will continue on current medications and need to have a close follow-up with his providers for further management.  Assessment and Plan: * Hypertensive urgency Blood pressure significantly elevated requiring Cleviprex  infusion as he  failed multiple bolus doses of labetalol  and hydralazine  in ED.  Apparently was on antihypertensives but has not seen physician for a while due to insurance issues. Blood pressure slowly improved with Cardene  infusion. Started on amlodipine , losartan  and HCTZ and need to have a close follow-up with  primary care provider for better assistance.  Elevated troponin Patient initially has some chest tightness likely secondary to hypertensive urgency, chest tightness resolved with some improvement in blood pressure. Mildly elevated troponin with a flat curve likely secondary to demand ischemia with significantly elevated blood pressure. -Echocardiogram with normal EF, grade 2 diastolic dysfunction and bilateral atrial dilatation. Patient was given referral to be seen in heart failure clinic and cardiology as outpatient.  D-dimer, elevated Mildly elevated D-dimer.  Lower extremity venous Doppler was negative for DVT and CTA chest was also obtained in ED and it was negative for PE.  Did show some concern of pulmonary hypertension.   Acute HFpEF. No prior diagnosis of heart failure.  Lower extremity edema on presentation with significantly elevated blood pressure.  Patient also has exertional dyspnea and orthopnea.  Echocardiogram with grade 2 diastolic dysfunction likely secondary to uncontrolled hypertension. Patient has a very profound response to IV diuresis which was not continued as he clinically appears euvolemic, he was started on HCTZ along with other blood pressure medications.  Microcytic anemia Patient denies any obvious bleeding.  Has not seen physician for a long time, no baseline screening.  Hemoglobin of 8.6 with microcytosis on presentation, improved to 9.3 on repeat.  Anemia panel with significant iron  deficiency.  49 year old gentleman with microcytic anemia and iron  deficiency will need outpatient colonoscopy and screening. Referral was provided to see outpatient gastroenterology. Patient received 1 dose of IV iron  and started on supplement.  Prediabetes.  A1c of 5.7.  Patient need to have a close follow-up with primary care provider for further assistance.  Obesity, Class II, BMI 35-39.9 Estimated body mass index is 38.42 kg/m as calculated from the following:   Height as  of this encounter: 6' (1.829 m).   Weight as of this encounter: 128.5 kg.   - Encouraged weight loss with exercise and healthy diet  Alcohol use - CIWA assessment without Ativan  for now, we can add Ativan  if needed  Tobacco abuse Counseling was provided. -Nicotine  patch as needed  Hypokalemia.  Secondary to diuresis, potassium was repleted.  Consultants: None Procedures performed: None Disposition: Home Diet recommendation:  Cardiac diet DISCHARGE MEDICATION: Allergies as of 05/20/2024       Reactions   Aspirin    Nsaids         Medication List     STOP taking these medications    folic acid  1 MG tablet Commonly known as: FOLVITE    lisinopril  10 MG tablet Commonly known as: ZESTRIL    nicotine  21 mg/24hr patch Commonly known as: NICODERM CQ  - dosed in mg/24 hours   thiamine  100 MG tablet Commonly known as: VITAMIN B1       TAKE these medications    acetaminophen  325 MG tablet Commonly known as: TYLENOL  Take 2 tablets (650 mg total) by mouth every 6 (six) hours as needed for mild pain or fever.   albuterol  108 (90 Base) MCG/ACT inhaler Commonly known as: VENTOLIN  HFA Inhale 2 puffs into the lungs every 6 (six) hours as needed for wheezing or shortness of breath.   amLODipine  10 MG tablet Commonly known as: NORVASC  Take 1 tablet (10 mg total) by mouth daily.   benzonatate  200  MG capsule Commonly known as: TESSALON  Take 1 capsule (200 mg total) by mouth 3 (three) times daily as needed for cough.   Fe Fum-Vit C-Vit B12-FA Caps capsule Commonly known as: TRIGELS-F FORTE Take 1 capsule by mouth 2 (two) times daily. Start taking on: May 21, 2024   hydrochlorothiazide  25 MG tablet Commonly known as: HYDRODIURIL  Take 1 tablet (25 mg total) by mouth daily.   losartan  50 MG tablet Commonly known as: COZAAR  Take 1 tablet (50 mg total) by mouth daily. Start taking on: May 21, 2024   meclizine  25 MG tablet Commonly known as: ANTIVERT  Take  1 tablet (25 mg total) by mouth 3 (three) times daily as needed for dizziness.   rosuvastatin  10 MG tablet Commonly known as: CRESTOR  Take 1 tablet (10 mg total) by mouth daily. Start taking on: May 21, 2024        Follow-up Information     Maryruth, Ole DASEN, MD. Schedule an appointment as soon as possible for a visit in 1 week(s).   Specialty: Gastroenterology Contact information: [address] [place] [address] [phone]         Darron Deatrice LABOR, MD. Schedule an appointment as soon as possible for a visit in 1 week(s).   Specialty: Cardiology Contact information: [address] [place] [address] [phone]                Discharge Exam: Fredricka Weights   05/19/24 0253 05/19/24 1245 05/20/24 0500  Weight: (!) 147.9 kg 128.5 kg 123.5 kg   General.  Obese gentleman, in no acute distress. Pulmonary.  Lungs clear bilaterally, normal respiratory effort. CV.  Regular rate and rhythm, no JVD, rub or murmur. Abdomen.  Soft, nontender, nondistended, BS positive. CNS.  Alert and oriented .  No focal neurologic deficit. Extremities.  No edema, no cyanosis, pulses intact and symmetrical. Psychiatry.  Judgment and insight appears normal.   Condition at discharge: stable  The results of significant diagnostics from this hospitalization (including imaging, microbiology, ancillary and laboratory) are listed below for reference.   Imaging Studies: ECHOCARDIOGRAM COMPLETE Result Date: 05/19/2024    ECHOCARDIOGRAM REPORT   Patient Name:   Bill Hammond Date of Exam: 05/19/2024 Medical Rec #:  [id number]       Height:       72.0 in Accession #:    [id number]      Weight:       283.3 lb Date of Birth:  1976/02/13       BSA:          2.470 m Patient Age:    48 years        BP:           160/89 mmHg Patient Gender: M               HR:           95 bpm. Exam Location:  [facility] Procedure: 2D Echo, Cardiac Doppler and Color Doppler (Both  Spectral and Color            Flow Doppler were utilized during procedure). Indications:     Elevated troponin  History:         Patient has no prior history of Echocardiogram examinations.                  Risk Factors:Hypertension. Renal disorder, tobacco abuse.  Sonographer:     Christopher Furnace Referring Phys:  [id number] Masayuki Sakai  Diagnosing Phys: Deatrice Cage MD IMPRESSIONS  1. Left ventricular ejection fraction, by estimation, is 55 to 60%. The left ventricle has normal function. The left ventricle has no regional wall motion abnormalities. There is moderate left ventricular hypertrophy. Left ventricular diastolic parameters are consistent with Grade II diastolic dysfunction (pseudonormalization).  2. Right ventricular systolic function is normal. The right ventricular size is normal. Tricuspid regurgitation signal is inadequate for assessing PA pressure.  3. Left atrial size was moderately dilated.  4. Right atrial size was moderately dilated.  5. The mitral valve is normal in structure. No evidence of mitral valve regurgitation. No evidence of mitral stenosis.  6. The aortic valve is normal in structure. Aortic valve regurgitation is not visualized. No aortic stenosis is present.  7. The inferior vena cava is normal in size with greater than 50% respiratory variability, suggesting right atrial pressure of 3 mmHg. FINDINGS  Left Ventricle: Left ventricular ejection fraction, by estimation, is 55 to 60%. The left ventricle has normal function. The left ventricle has no regional wall motion abnormalities. Global longitudinal strain performed but not reported based on interpreter judgement due to suboptimal tracking. 3D ejection fraction reviewed and evaluated as part of the interpretation. Alternate measurement of EF is felt to be most reflective of LV function. The left ventricular internal cavity size was normal in  size. There is moderate left ventricular hypertrophy. Left ventricular diastolic parameters are  consistent with Grade II diastolic dysfunction (pseudonormalization). Right Ventricle: The right ventricular size is normal. No increase in right ventricular wall thickness. Right ventricular systolic function is normal. Tricuspid regurgitation signal is inadequate for assessing PA pressure. The tricuspid regurgitant velocity is 1.97 m/s, and with an assumed right atrial pressure of 3 mmHg, the estimated right ventricular systolic pressure is 18.5 mmHg. Left Atrium: Left atrial size was moderately dilated. Right Atrium: Right atrial size was moderately dilated. Pericardium: Trivial pericardial effusion is present. Mitral Valve: The mitral valve is normal in structure. No evidence of mitral valve regurgitation. No evidence of mitral valve stenosis. MV peak gradient, 4.8 mmHg. The mean mitral valve gradient is 3.0 mmHg. Tricuspid Valve: The tricuspid valve is normal in structure. Tricuspid valve regurgitation is not demonstrated. No evidence of tricuspid stenosis. Aortic Valve: The aortic valve is normal in structure. Aortic valve regurgitation is not visualized. No aortic stenosis is present. Aortic valve mean gradient measures 2.0 mmHg. Aortic valve peak gradient measures 4.2 mmHg. Aortic valve area, by VTI measures 4.74 cm. Pulmonic Valve: The pulmonic valve was normal in structure. Pulmonic valve regurgitation is not visualized. No evidence of pulmonic stenosis. Aorta: The aortic root is normal in size and structure. Venous: The inferior vena cava is normal in size with greater than 50% respiratory variability, suggesting right atrial pressure of 3 mmHg. IAS/Shunts: No atrial level shunt detected by color flow Doppler.  LEFT VENTRICLE PLAX 2D LVIDd:         4.70 cm   Diastology LVIDs:         3.10 cm   LV e' medial:    5.11 cm/s LV PW:         2.10 cm   LV E/e' medial:  17.0 LV IVS:        1.80 cm   LV e' lateral:   4.68 cm/s LVOT diam:     2.30 cm   LV E/e' lateral: 18.6 LV SV:         64 LV SV Index:   26 LVOT  Area:  4.15 cm  RIGHT VENTRICLE RV Basal diam:  3.90 cm RV Mid diam:    3.90 cm RV S prime:     11.90 cm/s TAPSE (M-mode): 1.9 cm LEFT ATRIUM              Index        RIGHT ATRIUM           Index LA diam:        4.90 cm  1.98 cm/m   RA Area:     32.40 cm LA Vol (A2C):   124.0 ml 50.20 ml/m  RA Volume:   116.00 ml 46.96 ml/m LA Vol (A4C):   75.2 ml  30.44 ml/m LA Biplane Vol: 96.9 ml  39.23 ml/m  AORTIC VALVE AV Area (Vmax):    4.11 cm AV Area (Vmean):   4.49 cm AV Area (VTI):     4.74 cm AV Vmax:           102.00 cm/s AV Vmean:          62.900 cm/s AV VTI:            0.136 m AV Peak Grad:      4.2 mmHg AV Mean Grad:      2.0 mmHg LVOT Vmax:         101.00 cm/s LVOT Vmean:        67.900 cm/s LVOT VTI:          0.155 m LVOT/AV VTI ratio: 1.14  AORTA Ao Root diam: 3.60 cm MITRAL VALVE               TRICUSPID VALVE MV Area (PHT): 4.06 cm    TR Peak grad:   15.5 mmHg MV Area VTI:   2.91 cm    TR Vmax:        197.00 cm/s MV Peak grad:  4.8 mmHg MV Mean grad:  3.0 mmHg    SHUNTS MV Vmax:       1.09 m/s    Systemic VTI:  0.16 m MV Vmean:      78.8 cm/s   Systemic Diam: 2.30 cm MV Decel Time: 187 msec MV E velocity: 87.00 cm/s MV A velocity: 78.10 cm/s MV E/A ratio:  1.11 Deatrice Cage MD Electronically signed by Deatrice Cage MD Signature Date/Time: 05/19/2024/3:47:49 PM    Final    CT Angio Chest PE W and/or Wo Contrast Result Date: 05/19/2024 CLINICAL DATA:  Shortness of breath.  Elevated D-dimer. EXAM: CT ANGIOGRAPHY CHEST WITH CONTRAST TECHNIQUE: Multidetector CT imaging of the chest was performed using the standard protocol during bolus administration of intravenous contrast. Multiplanar CT image reconstructions and MIPs were obtained to evaluate the vascular anatomy. RADIATION DOSE REDUCTION: This exam was performed according to the departmental dose-optimization program which includes automated exposure control, adjustment of the mA and/or kV according to patient size and/or use of iterative  reconstruction technique. CONTRAST:  75mL OMNIPAQUE  IOHEXOL  350 MG/ML SOLN COMPARISON:  No comparison studies available. FINDINGS: Cardiovascular: The heart is enlarged. No substantial pericardial effusion. No thoracic aortic aneurysm. Enlargement of the pulmonary outflow tract/main pulmonary arteries suggests pulmonary arterial hypertension. There is no filling defect within the opacified pulmonary arteries to suggest the presence of an acute pulmonary embolus. Mediastinum/Nodes: No mediastinal lymphadenopathy. There is no hilar lymphadenopathy. The esophagus has normal imaging features. There is no axillary lymphadenopathy. Lungs/Pleura: No suspicious pulmonary nodule or mass. No focal airspace consolidation. No pleural effusion. No evidence for pulmonary edema. Upper Abdomen:  The liver shows diffusely decreased attenuation suggesting fat deposition. 2.2 cm right adrenal nodule has low density compatible with adenoma. No followup imaging is recommended. Musculoskeletal: No worrisome lytic or sclerotic osseous abnormality. Review of the MIP images confirms the above findings. IMPRESSION: 1. No CT evidence for acute pulmonary embolus. 2. Enlargement of the pulmonary outflow tract/main pulmonary arteries suggests pulmonary arterial hypertension. 3. Hepatic steatosis. Electronically Signed   By: Camellia Candle M.D.   On: 05/19/2024 07:16   US  Venous Img Lower Bilateral Result Date: 05/19/2024 EXAM: ULTRASOUND DUPLEX OF THE BILATERAL LOWER EXTREMITY VEINS TECHNIQUE: Duplex ultrasound using B-mode/gray scaled imaging and Doppler spectral analysis and color flow was obtained of the deep venous structures of the bilateral lower extremity. COMPARISON: None available. CLINICAL HISTORY: Lower extremity edema Lower extremity edema FINDINGS: The common femoral veins, femoral veins, popliteal veins, and posterior tibial veins demonstrate normal compressibility with normal color flow and spectral analysis. There is  pulsatility of the venous waveforms, which may be seen with tricuspid regurgitation, and right heart dysfunction. IMPRESSION: 1. No evidence of DVT. 2. Pulsatile venous waveforms. Electronically signed by: Francis Quam MD 05/19/2024 06:32 AM EST RP Workstation: [id number]   DG Chest 2 View Result Date: 05/19/2024 EXAM: 2 VIEW(S) XRAY OF THE CHEST 05/19/2024 03:16:00 AM COMPARISON: 03/13/22 CLINICAL HISTORY: cp FINDINGS: LUNGS AND PLEURA: Mild interstitial coarsening in the lower lungs. No pleural effusion. No pneumothorax. HEART AND MEDIASTINUM: Cardiomegaly. BONES AND SOFT TISSUES: No acute osseous abnormality. IMPRESSION: 1. Cardiomegaly. 2. Mild pulmonary edema. Electronically signed by: Norman Gatlin MD 05/19/2024 03:25 AM EST RP Workstation: [id number]    Microbiology: Results for orders placed or performed during the hospital encounter of 05/19/24  MRSA Next Gen by PCR, Nasal     Status: None   Collection Time: 05/19/24  1:02 PM   Specimen: Nasal Mucosa; Nasal Swab  Result Value Ref Range Status   MRSA by PCR Next Gen NOT DETECTED NOT DETECTED Final    Comment: (NOTE) The GeneXpert MRSA Assay (FDA approved for NASAL specimens only), is one component of a comprehensive MRSA colonization surveillance program. It is not intended to diagnose MRSA infection nor to guide or monitor treatment for MRSA infections. Test performance is not FDA approved in patients less than 109 years old. Performed at [facility], [address] [place], [place] [address]     Labs: CBC: Recent Labs  Lab 05/19/24 0500 05/19/24 1322 05/20/24 0343  WBC 6.0 6.5 7.4  HGB 8.6* 9.6* 9.3*  HCT 30.4* 33.9* 32.3*  MCV 77.7* 76.2* 75.8*  PLT 358 406* 382   Basic Metabolic Panel: Recent Labs  Lab 05/19/24 0500 05/19/24 1322 05/19/24 1703 05/20/24 0343  NA 136  --  140 139  K 3.9  --  3.2* 3.2*  CL 101  --  99 100  CO2 23  --  27 25  GLUCOSE 104*  --  91 94  BUN 7  --  10 8  CREATININE  0.77 0.84 0.92 0.76  CALCIUM  8.1*  --  8.6* 8.0*   Liver Function Tests: Recent Labs  Lab 05/19/24 0500  AST 46*  ALT 41  ALKPHOS 166*  BILITOT 0.4  PROT 6.9  ALBUMIN 3.9   CBG: Recent Labs  Lab 05/19/24 1254  GLUCAP 115*    Discharge time spent: greater than 30 minutes.  This record has been created using Conservation officer, historic buildings. Errors have been sought and corrected,but may not always be located. Such creation errors  do not reflect on the standard of care.   Signed: Amaryllis Dare, MD [facility] 05/20/2024 "

## 2024-05-20 NOTE — Plan of Care (Signed)
" °  Problem: Education: Goal: Knowledge of General Education information will improve Description: Including pain rating scale, medication(s)/side effects and non-pharmacologic comfort measures Outcome: Progressing   Problem: Health Behavior/Discharge Planning: Goal: Ability to manage health-related needs will improve Outcome: Not Progressing   Problem: Clinical Measurements: Goal: Respiratory complications will improve Outcome: Progressing Goal: Cardiovascular complication will be avoided Outcome: Progressing   "

## 2024-05-20 NOTE — Hospital Course (Signed)
 Bill Hammond is a 48 y.o. male with medical history significant of hypertension,Alcohol and tobacco abuse but not taking medications for a while, presented to ED with 2 weeks of progressive lower extremity edema, exertional dyspnea and orthopnea.  Denies any chest pain but does feel some chest tightness.  Per patient he stopped taking his blood pressure medications for a while due to insurance issues.   Patient denies any recent illness, upper respiratory or urinary symptoms.  No recent change in appetite or bowel habits.  Denies any obvious bleeding.   Patient is a current smoker and uses alcohol on most of the days.  On presentation patient has significantly elevated blood pressure at 246/158, labs with hemoglobin of 8.6, D-dimer 1.18, proBNP 4006, troponin 81>> 80, AST 46, alkaline phosphatase 166, UDS positive for THC, renal functions normal.  CXR with cardiomegaly and mild pulmonary edema.  CTA chest was negative for PE, enlarged pulmonary outflow tract suggestive of pulmonary arterial hypertension and hepatic steatosis. Lower extremity venous Doppler negative for DVT EKG with mild sinus tachycardia, right axis and no other significant abnormality, QTc of 476.   Patient was given multiple doses of labetalol  and hydralazine  and then started on Cleviprex  due to poor response.  Also received 1 dose of IV Lasix .  Cleviprex  was later transition with Cardene  so he can be managed in stepdown.  Echocardiogram with normal EF, biatrial dilatation and grade 2 diastolic dysfunction, likely secondary to uncontrolled hypertension.  Blood pressure slowly improved, he was started on amlodipine , losartan  and HCTZ.  Patient had a very good response with Lasix , UOP of more than 10 L in 1 day.  Lower extremity edema resolved.  No chest pain or shortness of breath.  Further diuresis was held.  Did develop mild hypokalemia due to diuresis which was repleted.  Patient was given a referral to see a cardiologist and  get established in heart failure clinic for better management of his diastolic heart failure.  For his microcytic anemia, he was found to have significant iron  deficiency, talked with GI and he was given a referral to see gastroenterology as outpatient as he will need proper screening to rule out any underlying colon malignancy.  He was given 1 dose of IV iron  and started on p.o. supplement.  Patient was found to have A1c of 5.7 which makes him prediabetic.  Lipid panel was normal but LDL of 95 with a goal less than 70, he was started on low-dose Crestor  and primary care provider can further monitor.  Patient should get established with a primary care provider, resources were provided.  He will continue on current medications and need to have a close follow-up with his providers for further management.

## 2024-05-20 NOTE — Plan of Care (Signed)

## 2024-05-20 NOTE — Progress Notes (Signed)
 Patient discharged home. AxOx4. VSS. Discharge education provided and discharge paperwork placed in discharge packet along with requested work note. All patient belongings kept at bedside sent home with patient.

## 2024-05-20 NOTE — TOC Initial Note (Signed)
 Transition of Care [facility]) - Initial/Assessment Note    Patient Details  Name: Bill Hammond MRN: [id number] Date of Birth: 05/08/1976  Transition of Care [facility]) CM/SW Contact:    Corrie JINNY Ruts, LCSW Phone Number: 05/20/2024, 1:40 PM  Clinical Narrative:                 Chart reviewed. Recived a secure chat that the patient needs PCP/medication assistance. I was able to speak with the patient at bedside today. I introduced myself, my role, and reason for consult. The patient reports that he does not have a PCP and was accepting of PCP resources added to AVS. The patient reports that he lives in the [place] with his aunt.   The patient reports that he was able to complete task independently. The patient reports that his aunt wold drive him to appointments if needed. The patient reports that he uses [facility]. The patient reports that he has never has HH or been admitted into a SNF in the past. The patient reports that he does not have equipment in the home. The patient reports that he does not have health insurance.   SW has added PCP to AVA, reached out to meds to bed and insurance department for assistance.      Barriers to Discharge: No Barriers Identified   Patient Goals and CMS Choice            Expected Discharge Plan and Services         Expected Discharge Date: 05/20/24                                    Prior Living Arrangements/Services   Lives with:: Relatives Patient language and need for interpreter reviewed:: Yes              Criminal Activity/Legal Involvement Pertinent to Current Situation/Hospitalization: No - Comment as needed  Activities of Daily Living   ADL Screening (condition at time of admission) Independently performs ADLs?: Yes (appropriate for developmental age) Is the patient deaf or have difficulty hearing?: No Does the patient have difficulty seeing, even when wearing glasses/contacts?: No Does the patient have difficulty  concentrating, remembering, or making decisions?: No  Permission Sought/Granted                  Emotional Assessment Appearance:: Appears stated age Attitude/Demeanor/Rapport: Gracious Affect (typically observed): Calm Orientation: : Oriented to Self, Oriented to Place, Oriented to  Time, Oriented to Situation Alcohol / Substance Use: Not Applicable Psych Involvement: No (comment)  Admission diagnosis:  Hypertensive crisis [I16.9] Hypertensive urgency [I16.0] Non compliance w medication regimen [Z91.148] Congestive heart failure, unspecified HF chronicity, unspecified heart failure type (HCC) [I50.9] Patient Active Problem List   Diagnosis Date Noted   Microcytic anemia 05/19/2024   Obesity, Class II, BMI 35-39.9 05/19/2024   Elevated troponin 05/19/2024   D-dimer, elevated 05/19/2024   Fluid overload 05/19/2024   Alcohol use 05/19/2024   Congestive heart failure (HCC) 05/19/2024   Non compliance w medication regimen 05/19/2024   Hypertensive urgency 03/13/2022   Tobacco abuse 03/13/2022   Obesity, Class III, BMI 40-49.9 (morbid obesity) (HCC) 03/13/2022   ETOH abuse    Dizziness    Priapism, drug-induced    Priapism    PCP:  Pcp, No Pharmacy:   [facility] [address] [place] - [address] [place] [address]  Phone: [phone] Fax: [phone]  [facility] [address] [place] [address] Phone: [phone] Fax: [phone]     Social Drivers of Health (SDOH) Social History: SDOH Screenings   Food Insecurity: No Food Insecurity (05/19/2024)  Housing: High Risk (05/19/2024)  Transportation Needs: No Transportation Needs (05/19/2024)  Utilities: Not At Risk (05/19/2024)  Tobacco Use: High Risk (05/19/2024)   SDOH Interventions:     Readmission Risk Interventions     No data to display

## 2024-06-19 ENCOUNTER — Ambulatory Visit: Attending: Cardiology | Admitting: Cardiology

## 2024-06-19 ENCOUNTER — Encounter: Payer: Self-pay | Admitting: Cardiology

## 2024-06-19 VITALS — BP 156/98 | HR 95 | Ht 72.0 in | Wt 271.0 lb

## 2024-06-19 DIAGNOSIS — I10 Essential (primary) hypertension: Secondary | ICD-10-CM | POA: Insufficient documentation

## 2024-06-19 DIAGNOSIS — F1010 Alcohol abuse, uncomplicated: Secondary | ICD-10-CM | POA: Insufficient documentation

## 2024-06-19 DIAGNOSIS — I5043 Acute on chronic combined systolic (congestive) and diastolic (congestive) heart failure: Secondary | ICD-10-CM | POA: Insufficient documentation

## 2024-06-19 DIAGNOSIS — F17200 Nicotine dependence, unspecified, uncomplicated: Secondary | ICD-10-CM | POA: Diagnosis present

## 2024-06-19 MED ORDER — LOSARTAN POTASSIUM 100 MG PO TABS: 100.0000 mg | ORAL_TABLET | Freq: Every day | ORAL | 3 refills | Status: AC

## 2024-06-19 NOTE — Patient Instructions (Signed)
 Medication Instructions:  - INCREASE losartan  to 100 mg daily   *If you need a refill on your cardiac medications before your next appointment, please call your pharmacy*  Lab Work: No labs ordered today  If you have labs (blood work) drawn today and your tests are completely normal, you will receive your results only by: MyChart Message (if you have MyChart) OR A paper copy in the mail If you have any lab test that is abnormal or we need to change your treatment, we will call you to review the results.  Testing/Procedures: No test ordered today   Follow-Up: At [facility], you and your health needs are our priority.  As part of our continuing mission to provide you with exceptional heart care, our providers are all part of one team.  This team includes your primary Cardiologist (physician) and Advanced Practice Providers or APPs (Physician Assistants and Nurse Practitioners) who all work together to provide you with the care you need, when you need it.  Your next appointment:   2 month(s)  Provider:   You may see Dr Darliss or one of the following Advanced Practice Providers on your designated Care Team:   Lonni Meager, NP Lesley Maffucci, PA-C Bernardino Bring, PA-C Cadence [place], PA-C Tylene Lunch, NP Barnie Hila, NP    We recommend signing up for the patient portal called MyChart.  Sign up information is provided on this After Visit Summary.  MyChart is used to connect with patients for Virtual Visits (Telemedicine).  Patients are able to view lab/test results, encounter notes, upcoming appointments, etc.  Non-urgent messages can be sent to your provider as well.   To learn more about what you can do with MyChart, go to [email or web address].

## 2024-06-19 NOTE — Progress Notes (Signed)
 " Cardiology Office Note:    Date:  06/19/2024   ID:  Bill Hammond, DOB [birth year], MRN [id number]  PCP:  Pcp, No   [facility] Providers Cardiologist:  None     Referring MD: Caleen Qualia, MD   Chief Complaint  Patient presents with   Hypertension    Patient referred for Hypertensive urgency, Elevated troponin, and Acute diastolic congestive heart failure. Patient states that since the fluid came off his breathing has been good. Meds reviewed.       History of Present Illness:    Bill Hammond is a 49 y.o. male with a hx of hypertension, EtOH use, current smoker x 25+ years who presents due to elevated BP.  Presented to the ED month ago with shortness of breath and edema.  Diagnosed with significantly elevated BP/hypertensive urgency with BP 246/158.  Supposed to be on BP meds but patient was not taking his BP medications for almost a year prior to ED visit due to insurance issues.  Imaging/chest x-ray showed mild pulmonary edema.  Patient managed with IV labetalol , IV hydralazine  also received a dose of IV Lasix .  On discharge, he was started on amlodipine , losartan , HCTZ.  Denies any subsequent edema since discharge from the hospital.  Compliant to medications as prescribed.  He still smokes, drinks  two 40 ounces of beer daily..  Echocardiogram 04/2024 EF 55 to 60%, moderate LVH, grade 2 diastolic dysfunction.  Past Medical History:  Diagnosis Date   ETOH abuse    Hypertension    Priapism    Renal disorder    kidney failure   Tobacco abuse     Past Surgical History:  Procedure Laterality Date   PRIAPISM REPAIR N/A 05/28/2018   Procedure: PENILE SHUNT;  Surgeon: Sherrilee Belvie CROME, MD;  Location: [facility] ORS;  Service: Urology;  Laterality: N/A;   PRIAPISM REPAIR N/A 06/05/2018   Procedure: DISTAL PENILE SHUNT (TSHUNT);  Surgeon: Francisca Redell BROCKS, MD;  Location: [facility] ORS;  Service: Urology;  Laterality: N/A;    Current Medications: Active Medications[1]    Allergies:   Aspirin and Nsaids   Social History   Socioeconomic History   Marital status: Single    Spouse name: Not on file   Number of children: Not on file   Years of education: Not on file   Highest education level: Not on file  Occupational History   Not on file  Tobacco Use   Smoking status: Every Day    Current packs/day: 1.00    Types: Cigarettes   Smokeless tobacco: Never  Vaping Use   Vaping status: Never Used  Substance and Sexual Activity   Alcohol use: Yes    Comment: drinks 2 40 ounces a day   Drug use: No    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    Sexual activity: Yes    Birth control/protection: None  Other Topics Concern   Not on file  Social History Narrative   Not on file   Social Drivers of Health   Tobacco Use: High Risk (06/19/2024)   Patient History    Smoking Tobacco Use: Every Day    Smokeless Tobacco Use: Never    Passive Exposure: Not on file  Financial Resource Strain: Not on file  Food Insecurity: No Food Insecurity (05/19/2024)   Epic    Worried About Radiation Protection Practitioner of Food in the Last Year: Never true    Ran Out of Food in the Last  Year: Never true  Transportation Needs: No Transportation Needs (05/19/2024)   Epic    Lack of Transportation (Medical): No    Lack of Transportation (Non-Medical): No  Physical Activity: Not on file  Stress: Not on file  Social Connections: Not on file  Depression (EYV7-0): Not on file  Alcohol Screen: Not on file  Housing: High Risk (05/19/2024)   Epic    Unable to Pay for Housing in the Last Year: Yes    Number of Times Moved in the Last Year: 0    Homeless in the Last Year: No  Utilities: Not At Risk (05/19/2024)   Epic    Threatened with loss of utilities: No  Health Literacy: Not on file     Family History: The patient's family history is not on file.  ROS:   Please see the history of present illness.     All other systems reviewed and are negative.  EKGs/Labs/Other Studies  Reviewed:    The following studies were reviewed today:  EKG Interpretation Date/Time:  Friday June 19 2024 13:56:25 EST Ventricular Rate:  95 PR Interval:  164 QRS Duration:  92 QT Interval:  370 QTC Calculation: 464 R Axis:   33  Text Interpretation: Normal sinus rhythm Possible Left atrial enlargement Nonspecific T wave abnormality Prolonged QT Confirmed by Darliss Rogue ([id number]) on 06/19/2024 2:07:48 PM    Recent Labs: 05/19/2024: ALT 41; Pro Brain Natriuretic Peptide 4,006.0 05/20/2024: BUN 8; Creatinine, Ser 0.76; Hemoglobin 9.3; Platelets 382; Potassium 3.2; Sodium 139  Recent Lipid Panel    Component Value Date/Time   CHOL 155 05/19/2024 1322   TRIG 73 05/19/2024 1322   HDL 45 05/19/2024 1322   CHOLHDL 3.4 05/19/2024 1322   VLDL 15 05/19/2024 1322   LDLCALC 95 05/19/2024 1322     Risk Assessment/Calculations:            Physical Exam:    VS:  BP (!) 156/98 (BP Location: Left Arm, Patient Position: Sitting, Cuff Size: Normal)   Pulse 95   Ht 6' (1.829 m)   Wt 271 lb (122.9 kg)   SpO2 99%   BMI 36.75 kg/m     Wt Readings from Last 3 Encounters:  06/19/24 271 lb (122.9 kg)  05/20/24 272 lb 4.3 oz (123.5 kg)  03/14/22 287 lb 0.6 oz (130.2 kg)     GEN:  Well nourished, well developed in no acute distress HEENT: Normal NECK: No JVD; No carotid bruits CARDIAC: RRR, no murmurs, rubs, gallops RESPIRATORY:  Clear to auscultation without rales, wheezing or rhonchi  ABDOMEN: Soft, non-tender, non-distended MUSCULOSKELETAL:  No edema; No deformity  SKIN: Warm and dry NEUROLOGIC:  Alert and oriented x 3 PSYCHIATRIC:  Normal affect   ASSESSMENT:    1. Primary hypertension   2. Acute on chronic combined systolic and diastolic CHF (congestive heart failure) (HCC)   3. Current smoker   4. ETOH abuse    PLAN:    In order of problems listed above:  Hypertension, BP elevated.  Increase losartan  to 100 mg daily.  Continue Norvasc  10 mg daily, HCTZ 25  mg daily.  Low-salt diet emphasized.  Consider Aldactone if BP stays elevated. HFpEF, grade 2 diastolic dysfunction, appears euvolemic.  Continue losartan , HCTZ, Norvasc  as above. Current smoker, smoking cessation advised. EtOH abuse, cessation advised.  Follow-up in 2 months      Medication Adjustments/Labs and Tests Ordered: Current medicines are reviewed at length with the patient today.  Concerns regarding medicines are  outlined above.  Orders Placed This Encounter  Procedures   EKG 12-Lead   Meds ordered this encounter  Medications   losartan  (COZAAR ) 100 MG tablet    Sig: Take 1 tablet (100 mg total) by mouth daily.    Dispense:  90 tablet    Refill:  3    Patient Instructions  Medication Instructions:  - INCREASE losartan  to 100 mg daily   *If you need a refill on your cardiac medications before your next appointment, please call your pharmacy*  Lab Work: No labs ordered today  If you have labs (blood work) drawn today and your tests are completely normal, you will receive your results only by: MyChart Message (if you have MyChart) OR A paper copy in the mail If you have any lab test that is abnormal or we need to change your treatment, we will call you to review the results.  Testing/Procedures: No test ordered today   Follow-Up: At [facility], you and your health needs are our priority.  As part of our continuing mission to provide you with exceptional heart care, our providers are all part of one team.  This team includes your primary Cardiologist (physician) and Advanced Practice Providers or APPs (Physician Assistants and Nurse Practitioners) who all work together to provide you with the care you need, when you need it.  Your next appointment:   2 month(s)  Provider:   You may see Dr Darliss or one of the following Advanced Practice Providers on your designated Care Team:   Lonni Meager, NP Lesley Maffucci, PA-C Bernardino Bring, PA-C Cadence  [place], PA-C Tylene Lunch, NP Barnie Hila, NP    We recommend signing up for the patient portal called MyChart.  Sign up information is provided on this After Visit Summary.  MyChart is used to connect with patients for Virtual Visits (Telemedicine).  Patients are able to view lab/test results, encounter notes, upcoming appointments, etc.  Non-urgent messages can be sent to your provider as well.   To learn more about what you can do with MyChart, go to [email or web address].              Signed, Redell Darliss, MD  06/19/2024 2:57 PM     [facility]    [1]  Current Meds  Medication Sig   acetaminophen  (TYLENOL ) 325 MG tablet Take 2 tablets (650 mg total) by mouth every 6 (six) hours as needed for mild pain or fever.   albuterol  (VENTOLIN  HFA) 108 (90 Base) MCG/ACT inhaler Inhale 2 puffs into the lungs every 6 (six) hours as needed for wheezing or shortness of breath.   amLODipine  (NORVASC ) 10 MG tablet Take 1 tablet (10 mg total) by mouth daily.   benzonatate  (TESSALON ) 200 MG capsule Take 1 capsule (200 mg total) by mouth 3 (three) times daily as needed for cough.   Fe Fum-Vit C-Vit B12-FA (TRIGELS-F FORTE) CAPS capsule Take 1 capsule by mouth 2 (two) times daily.   hydrochlorothiazide  (HYDRODIURIL ) 25 MG tablet Take 1 tablet (25 mg total) by mouth daily.   rosuvastatin  (CRESTOR ) 10 MG tablet Take 1 tablet (10 mg total) by mouth daily.   [DISCONTINUED] losartan  (COZAAR ) 50 MG tablet Take 1 tablet (50 mg total) by mouth daily.   "

## 2024-06-29 ENCOUNTER — Other Ambulatory Visit: Payer: Self-pay

## 2024-07-01 ENCOUNTER — Other Ambulatory Visit: Payer: Self-pay

## 2024-07-16 ENCOUNTER — Ambulatory Visit: Payer: Self-pay | Admitting: Family Medicine

## 2024-07-31 ENCOUNTER — Ambulatory Visit: Admit: 2024-07-31 | Admitting: Gastroenterology

## 2024-08-28 ENCOUNTER — Ambulatory Visit: Admitting: Cardiology
